# Patient Record
Sex: Male | Born: 1960 | Race: White | Hispanic: No | Marital: Married | Smoking: Never smoker
Health system: Southern US, Community
[De-identification: ages and names within clinical notes are randomized; demographics above are authoritative.]

## PROBLEM LIST (undated history)

## (undated) DIAGNOSIS — E785 Hyperlipidemia, unspecified: Secondary | ICD-10-CM

## (undated) DIAGNOSIS — Z87442 Personal history of urinary calculi: Secondary | ICD-10-CM

## (undated) DIAGNOSIS — J302 Other seasonal allergic rhinitis: Secondary | ICD-10-CM

## (undated) DIAGNOSIS — I214 Non-ST elevation (NSTEMI) myocardial infarction: Secondary | ICD-10-CM

## (undated) DIAGNOSIS — I209 Angina pectoris, unspecified: Secondary | ICD-10-CM

## (undated) DIAGNOSIS — I2510 Atherosclerotic heart disease of native coronary artery without angina pectoris: Secondary | ICD-10-CM

## (undated) DIAGNOSIS — I700 Atherosclerosis of aorta: Secondary | ICD-10-CM

## (undated) DIAGNOSIS — N200 Calculus of kidney: Secondary | ICD-10-CM

## (undated) HISTORY — DX: Calculus of kidney: N20.0

## (undated) HISTORY — PX: CORONARY ANGIOPLASTY WITH STENT PLACEMENT: SHX49

## (undated) HISTORY — PX: HERNIA REPAIR: SHX51

## (undated) HISTORY — PX: SHOULDER ARTHROSCOPY: SHX128

## (undated) HISTORY — PX: CYST EXCISION: SHX5701

---

## 2006-05-09 ENCOUNTER — Ambulatory Visit: Payer: Self-pay | Admitting: Family Medicine

## 2009-08-17 ENCOUNTER — Ambulatory Visit: Payer: Self-pay | Admitting: Family Medicine

## 2010-01-16 ENCOUNTER — Ambulatory Visit: Payer: Self-pay | Admitting: Family Medicine

## 2010-06-27 ENCOUNTER — Emergency Department: Payer: Self-pay | Admitting: Emergency Medicine

## 2011-08-23 IMAGING — CR DG CHEST 2V
1 series · 2 of 2 positions shown · non-contrast
Comparison: none

REASON FOR EXAM: chest tightness
COMMENTS:

PROCEDURE:     MDR - MDR CHEST PA(OR AP) AND LATERAL  - August 17, 2009 [DATE]
RESULT:     The lungs are clear. The cardiac silhouette and visualized bony
skeleton are unremarkable.

[Series 1: view not recorded · 0.17mm/px · 2 of 2 slices shown]
[im 1/2]
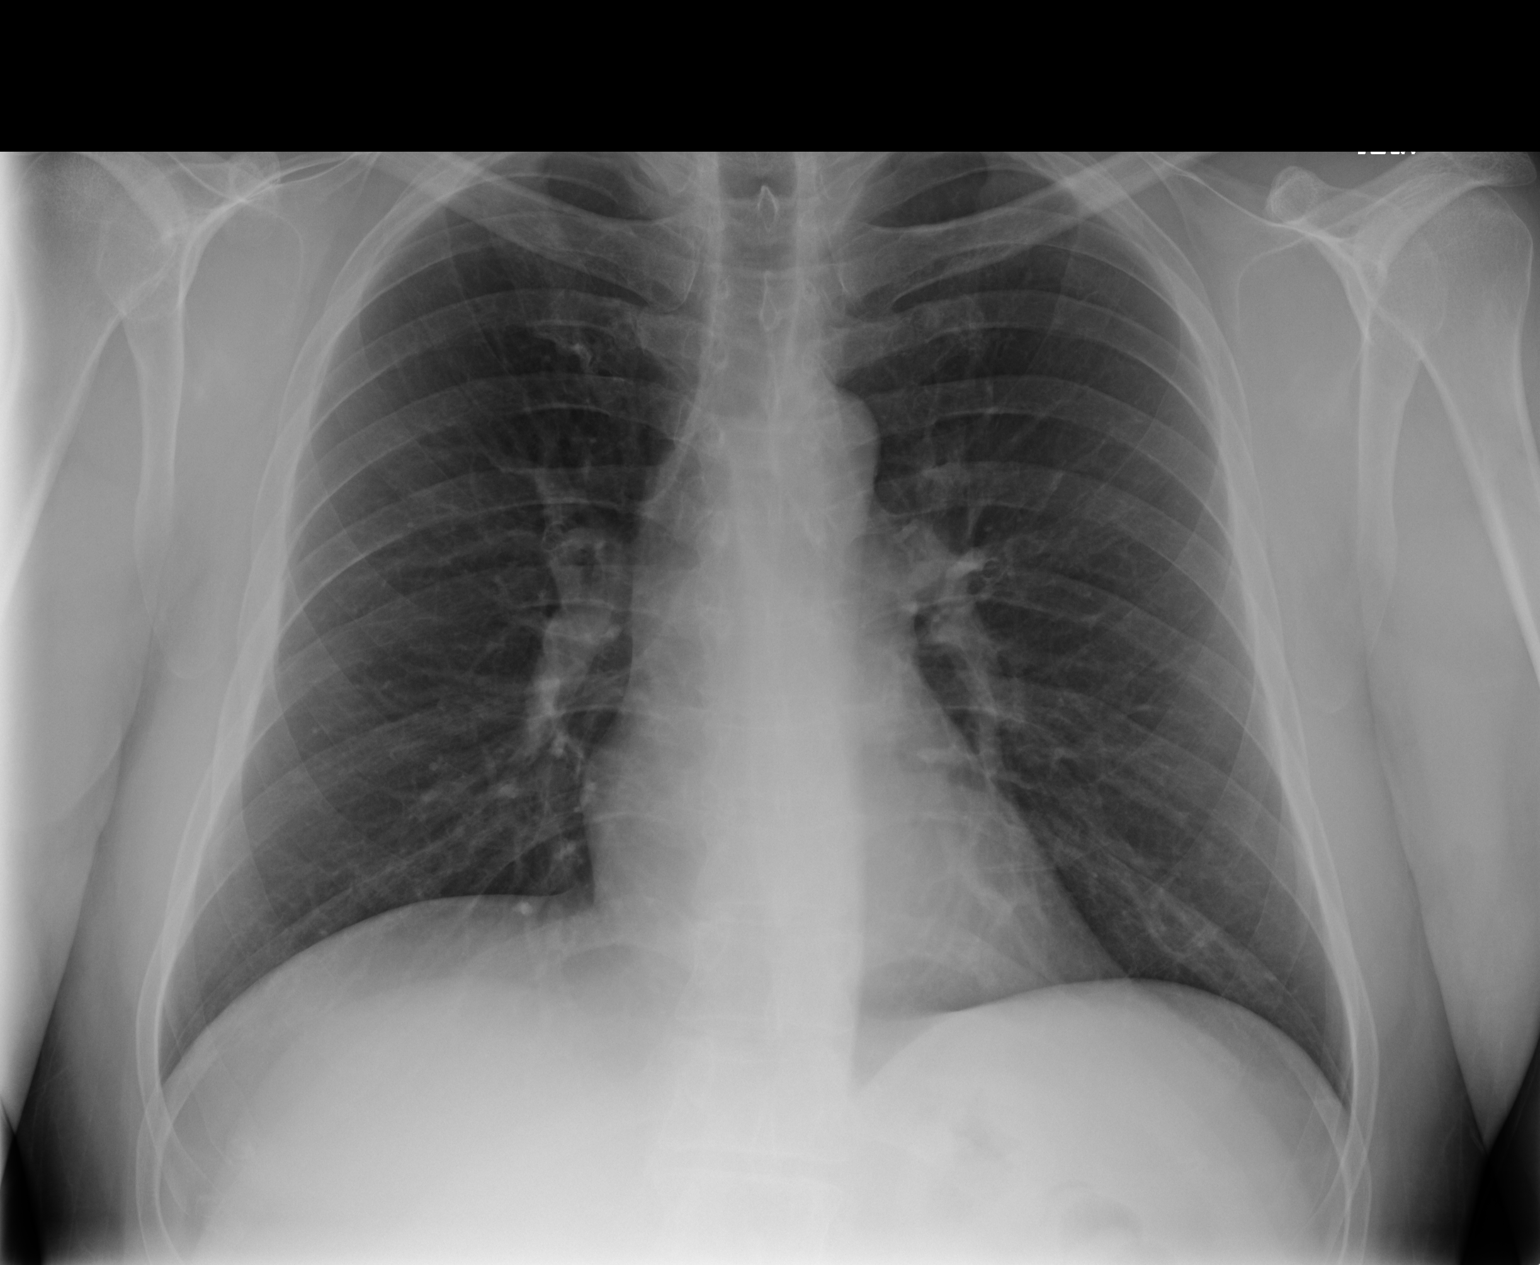
[im 2/2]
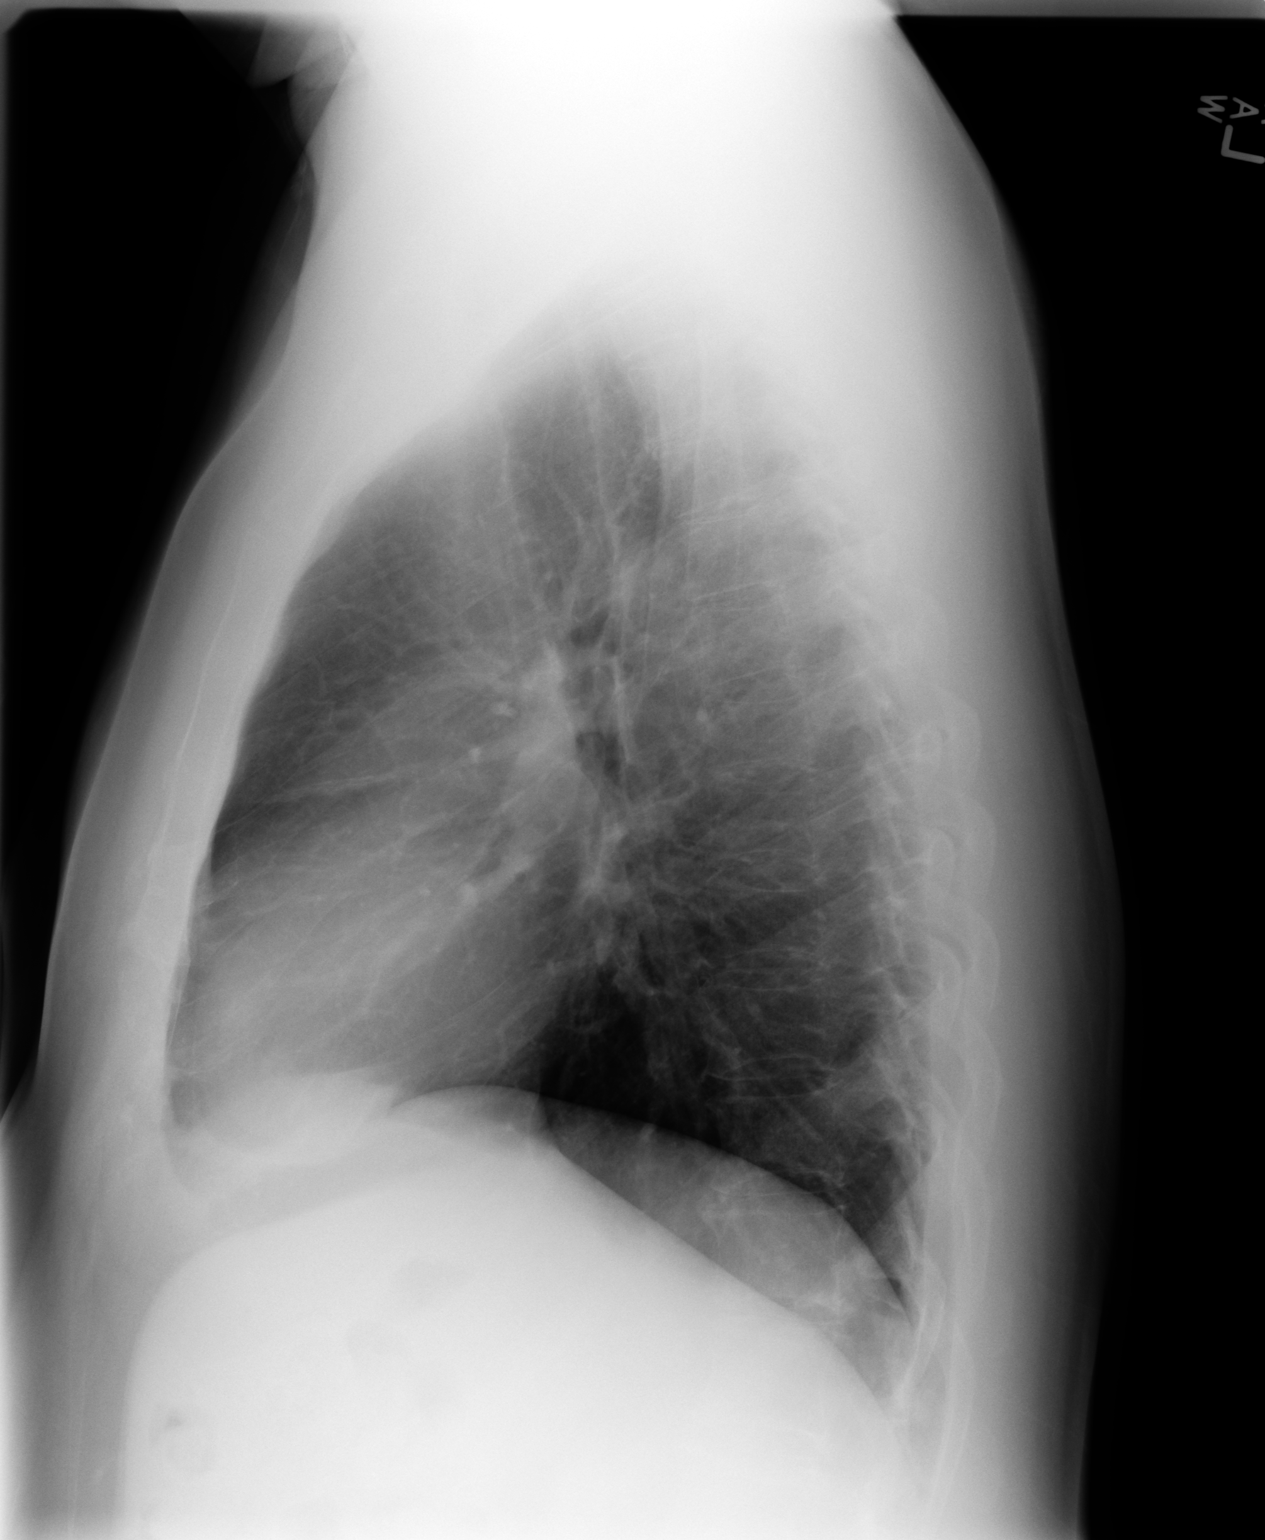

[2 of 2 positions shown; findings below may reference images not displayed]

IMPRESSION: 1. Chest radiograph without evidence of acute cardiopulmonary disease.

## 2012-01-18 ENCOUNTER — Emergency Department: Payer: Self-pay | Admitting: Emergency Medicine

## 2012-01-18 LAB — BASIC METABOLIC PANEL
Anion Gap: 11 (ref 7–16)
Calcium, Total: 8.9 mg/dL (ref 8.5–10.1)
Chloride: 103 mmol/L (ref 98–107)
Co2: 28 mmol/L (ref 21–32)
Creatinine: 0.92 mg/dL (ref 0.60–1.30)
Osmolality: 284 (ref 275–301)

## 2012-01-18 LAB — CBC
HCT: 42 % (ref 40.0–52.0)
HGB: 14.2 g/dL (ref 13.0–18.0)
MCH: 30.6 pg (ref 26.0–34.0)
MCV: 90 fL (ref 80–100)
Platelet: 204 10*3/uL (ref 150–440)
RBC: 4.65 10*6/uL (ref 4.40–5.90)
WBC: 6.7 10*3/uL (ref 3.8–10.6)

## 2012-01-18 LAB — TROPONIN I: Troponin-I: <0.02 ng/mL

## 2012-10-15 ENCOUNTER — Ambulatory Visit: Payer: Self-pay | Admitting: Family Medicine

## 2014-07-24 DIAGNOSIS — J309 Allergic rhinitis, unspecified: Secondary | ICD-10-CM | POA: Insufficient documentation

## 2014-10-01 ENCOUNTER — Ambulatory Visit: Payer: Self-pay | Admitting: Physician Assistant

## 2014-10-01 LAB — RAPID INFLUENZA A&B ANTIGENS

## 2015-05-17 ENCOUNTER — Ambulatory Visit
Admission: EM | Admit: 2015-05-17 | Discharge: 2015-05-17 | Disposition: A | Discharge status: Home / Self Care | Payer: Federal, State, Local not specified - PPO | Attending: Family Medicine | Admitting: Family Medicine

## 2015-05-17 ENCOUNTER — Encounter: Payer: Self-pay | Admitting: Emergency Medicine

## 2015-05-17 DIAGNOSIS — J029 Acute pharyngitis, unspecified: Secondary | ICD-10-CM | POA: Diagnosis present

## 2015-05-17 HISTORY — DX: Other seasonal allergic rhinitis: J30.2

## 2015-05-17 LAB — RAPID STREP SCREEN (MED CTR MEBANE ONLY): STREPTOCOCCUS, GROUP A SCREEN (DIRECT): NEGATIVE

## 2015-05-17 MED ORDER — AZITHROMYCIN 250 MG PO TABS: ORAL_TABLET | ORAL | Status: DC

## 2015-05-17 NOTE — ED Notes (Signed)
C/o sore throat since Wednesday associtaed with running nose , denies any fever at this time

## 2015-05-17 NOTE — ED Provider Notes (Signed)
CSN: [id number]     Arrival date & time 05/17/15  0807 History   First MD Initiated Contact with Patient 05/17/15 [phone]     Chief Complaint  Patient presents with  . Sore Throat   (Consider location/radiation/quality/duration/timing/severity/associated sxs/prior Treatment) HPI Comments: 54 yo male with a 3 days h/o sore throat, pain with swallowing and sweats. States has had a mild runny nose, but that symptoms don't feel like a cold. Also sick contact;  was exposed to strep last week. Denies chest pains or shortness of breath.   Patient is a 54 y.o. male presenting with pharyngitis. The history is provided by the patient.  Sore Throat This is a new problem. The current episode started more than 2 days ago. The problem occurs constantly. The problem has been gradually worsening.    Past Medical History  Diagnosis Date  . Seasonal allergies    Past Surgical History  Procedure Laterality Date  . Hernia repair    . Shoulder arthroscopy    . Cyst excision     History reviewed. No pertinent family history. History  Substance Use Topics  . Smoking status: Never Smoker   . Smokeless tobacco: Not on file  . Alcohol Use: No    Review of Systems  Allergies  Codeine and Penicillins  Home Medications   Prior to Admission medications   Medication Sig Start Date End Date Taking? Authorizing Provider  fluticasone (FLONASE) 50 MCG/ACT nasal spray Place into both nostrils daily.   Yes Historical Provider, MD  loratadine (CLARITIN) 10 MG tablet Take 10 mg by mouth daily.   Yes Historical Provider, MD  azithromycin (ZITHROMAX Z-PAK) 250 MG tablet 2 tabs po once day 1, then 1 tab po qd for next 4 days 05/17/15   Payton Mccallumrlando Lyniah Fujita, MD   BP 125/92 mmHg  Pulse 75  Temp(Src) 98.2 F (36.8 C) (Oral)  Resp 18  Ht 5\' 9"  (1.753 m)  Wt 198 lb (89.812 kg)  BMI 29.23 kg/m2  SpO2 95% Physical Exam  Constitutional: He appears well-developed and well-nourished. No distress.  HENT:  Head:  Normocephalic and atraumatic.  Right Ear: Tympanic membrane, external ear and ear canal normal.  Left Ear: Tympanic membrane, external ear and ear canal normal.  Nose: Nose normal.  Mouth/Throat: Uvula is midline and mucous membranes are normal. Posterior oropharyngeal erythema present. No oropharyngeal exudate, posterior oropharyngeal edema or tonsillar abscesses.  Eyes: Conjunctivae and EOM are normal. Pupils are equal, round, and reactive to light. Right eye exhibits no discharge. Left eye exhibits no discharge. No scleral icterus.  Neck: Normal range of motion. Neck supple. No tracheal deviation present. No thyromegaly present.  Cardiovascular: Normal rate, regular rhythm and normal heart sounds.   Pulmonary/Chest: Effort normal and breath sounds normal. No stridor. No respiratory distress. He has no wheezes. He has no rales. He exhibits no tenderness.  Lymphadenopathy:    He has no cervical adenopathy.  Neurological: He is alert.  Skin: Skin is warm and dry. No rash noted. He is not diaphoretic.  Nursing note and vitals reviewed.   ED Course  Procedures (including critical care time) Labs Review Labs Reviewed  RAPID STREP SCREEN (NOT AT [facility])  CULTURE, GROUP A STREP ([facility] ONLY)    Imaging Review No results found.   MDM   1. Pharyngitis   (likely strep)  Discharge Medication List as of 05/17/2015  8:52 AM    START taking these medications   Details  azithromycin (ZITHROMAX Z-PAK) 250 MG tablet  2 tabs po once day 1, then 1 tab po qd for next 4 days, Normal      Plan: 1. Test results and diagnosis reviewed with patient 2. rx as per orders; risks, benefits, potential side effects reviewed with patient 3. Recommend supportive treatment with salt water gargles, otc analgesics prn 4. F/u prn if symptoms worsen or don't improve    Payton Mccallum, MD 05/17/15 [phone]

## 2015-05-20 LAB — CULTURE, GROUP A STREP (THRC)

## 2015-10-23 DIAGNOSIS — M19019 Primary osteoarthritis, unspecified shoulder: Secondary | ICD-10-CM | POA: Insufficient documentation

## 2016-01-27 ENCOUNTER — Encounter: Payer: Self-pay | Admitting: *Deleted

## 2016-01-27 DIAGNOSIS — R079 Chest pain, unspecified: Secondary | ICD-10-CM | POA: Insufficient documentation

## 2016-01-27 DIAGNOSIS — Z880 Allergy status to penicillin: Secondary | ICD-10-CM | POA: Diagnosis not present

## 2016-01-27 DIAGNOSIS — Z79899 Other long term (current) drug therapy: Secondary | ICD-10-CM | POA: Insufficient documentation

## 2016-01-27 DIAGNOSIS — Z791 Long term (current) use of non-steroidal anti-inflammatories (NSAID): Secondary | ICD-10-CM | POA: Insufficient documentation

## 2016-01-27 DIAGNOSIS — R1013 Epigastric pain: Secondary | ICD-10-CM | POA: Insufficient documentation

## 2016-01-27 DIAGNOSIS — Z7951 Long term (current) use of inhaled steroids: Secondary | ICD-10-CM | POA: Diagnosis not present

## 2016-01-27 LAB — CBC
HCT: 40.9 % (ref 40.0–52.0)
HEMOGLOBIN: 14.2 g/dL (ref 13.0–18.0)
MCH: 30.7 pg (ref 26.0–34.0)
MCHC: 34.7 g/dL (ref 32.0–36.0)
MCV: 88.5 fL (ref 80.0–100.0)
Platelets: 214 10*3/uL (ref 150–440)
RBC: 4.63 MIL/uL (ref 4.40–5.90)
RDW: 12.9 % (ref 11.5–14.5)
WBC: 11.6 10*3/uL — AB (ref 3.8–10.6)

## 2016-01-27 LAB — BASIC METABOLIC PANEL
ANION GAP: 6 (ref 5–15)
BUN: 22 mg/dL — ABNORMAL HIGH (ref 6–20)
CHLORIDE: 106 mmol/L (ref 101–111)
CO2: 26 mmol/L (ref 22–32)
Calcium: 8.8 mg/dL — ABNORMAL LOW (ref 8.9–10.3)
Creatinine, Ser: 0.95 mg/dL (ref 0.61–1.24)
Glucose, Bld: 98 mg/dL (ref 65–99)
POTASSIUM: 3.5 mmol/L (ref 3.5–5.1)
SODIUM: 138 mmol/L (ref 135–145)

## 2016-01-27 LAB — TROPONIN I

## 2016-01-27 NOTE — ED Notes (Signed)
Pt reports he has chest pain and pressure in center of chest.  nonradiating pain.  Sx for 5-6 days.  No n/v/d.  No sob. Nonsmoker.  Pt alert.

## 2016-01-28 ENCOUNTER — Emergency Department
Admission: EM | Admit: 2016-01-28 | Discharge: 2016-01-28 | Disposition: A | Discharge status: Home / Self Care | Payer: Federal, State, Local not specified - PPO | Attending: Emergency Medicine | Admitting: Emergency Medicine

## 2016-01-28 ENCOUNTER — Emergency Department: Payer: Federal, State, Local not specified - PPO

## 2016-01-28 DIAGNOSIS — R079 Chest pain, unspecified: Secondary | ICD-10-CM

## 2016-01-28 LAB — HEPATIC FUNCTION PANEL
ALK PHOS: 58 U/L (ref 38–126)
ALT: 23 U/L (ref 17–63)
AST: 23 U/L (ref 15–41)
Albumin: 4.2 g/dL (ref 3.5–5.0)
Bilirubin, Direct: 0.1 mg/dL (ref 0.1–0.5)
Indirect Bilirubin: 0.6 mg/dL (ref 0.3–0.9)
Total Bilirubin: 0.7 mg/dL (ref 0.3–1.2)
Total Protein: 7.4 g/dL (ref 6.5–8.1)

## 2016-01-28 LAB — LIPASE, BLOOD: Lipase: 23 U/L (ref 11–51)

## 2016-01-28 NOTE — ED Provider Notes (Signed)
[facility] Emergency Department Provider Note  ____________________________________________  Time seen: Approximately 12:58 AM  I have reviewed the triage vital signs and the nursing notes.   HISTORY  Chief Complaint Chest Pain    HPI Brian Waters is a 55 y.o. male presents to the ED from home with a chief complain of chest pain. Onset of symptoms 1-1/2 weeks ago. Patient describes central chest tightness/pressure without associated diaphoresis, nausea/vomiting, shortness of breath or dizziness. Patient states he occasionally feels palpitations. 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 Denies recent fever, chills, cough, congestion, abdominal pain, diarrhea. [place] to [place] last [place]; he was already experiencing chest pain before traveling. Denies calf pain or swelling upon his return. Denies recent trauma.   Past Medical History  Diagnosis Date  . Seasonal allergies     There are no active problems to display for this patient.   Past Surgical History  Procedure Laterality Date  . Hernia repair    . Shoulder arthroscopy    . Cyst excision      Current Outpatient Rx  Name  Route  Sig  Dispense  Refill  . fluticasone (FLONASE) 50 MCG/ACT nasal spray   Each Nare   Place 2 sprays into both nostrils daily.          Marland Kitchen. loratadine (CLARITIN) 10 MG tablet   Oral   Take 10 mg by mouth daily.         . naproxen (NAPROSYN) 375 MG tablet   Oral   Take 375 mg by mouth 2 (two) times daily with a meal.         . azithromycin (ZITHROMAX Z-PAK) 250 MG tablet      2 tabs po once day 1, then 1 tab po qd for next 4 days Patient not taking: Reported on 01/28/2016   6 each   0     Allergies Codeine and Penicillins  No family history on file.  Social History Social History  Substance Use Topics  . Smoking status: Never Smoker   . Smokeless tobacco: None   . Alcohol Use: Yes    Review of Systems  Constitutional: No fever/chills Eyes: No visual changes. ENT: No sore throat. Cardiovascular: Positive for chest pain. Respiratory: Denies shortness of breath. Gastrointestinal: No abdominal pain.  No nausea, no vomiting.  No diarrhea.  No constipation. Genitourinary: Negative for dysuria. Musculoskeletal: Negative for back pain. Skin: Negative for rash. Neurological: Negative for headaches, focal weakness or numbness.  10-point ROS otherwise negative.  ____________________________________________   PHYSICAL EXAM:  VITAL SIGNS: ED Triage Vitals  Enc Vitals Group     BP 01/27/16 2000 144/85 mmHg     Pulse Rate 01/27/16 2000 68     Resp 01/27/16 2000 18     Temp 01/27/16 2000 97.8 F (36.6 C)     Temp Source 01/27/16 2000 Oral     SpO2 01/27/16 2000 98 %     Weight 01/27/16 2000 196 lb (88.905 kg)     Height 01/27/16 2000 5\' 9"  (1.753 m)     Head Cir --      Peak Flow --      Pain Score 01/27/16 2004 2     Pain Loc --      Pain Edu? --      Excl. in GC? --     Constitutional: Alert and oriented. Well appearing  and in no acute distress. Eyes: Conjunctivae are normal. PERRL. EOMI. Head: Atraumatic. Nose: No congestion/rhinnorhea. Mouth/Throat: Mucous membranes are moist.  Oropharynx non-erythematous. Neck: No stridor.  No carotid bruits. Cardiovascular: Normal rate, regular rhythm. Grossly normal heart sounds.  Good peripheral circulation. Respiratory: Normal respiratory effort.  No retractions. Lungs CTAB. Gastrointestinal: Soft and minimally tender to palpation epigastrium without rebound or guarding. No distention. No abdominal bruits. No CVA tenderness. Musculoskeletal: No lower extremity tenderness nor edema.  No joint effusions. Neurologic:  Normal speech and language. No gross focal neurologic deficits are appreciated. No gait instability. Skin:  Skin is warm, dry and intact. No rash noted. Psychiatric: Mood and  affect are normal. Speech and behavior are normal.  ____________________________________________   LABS (all labs ordered are listed, but only abnormal results are displayed)  Labs Reviewed  BASIC METABOLIC PANEL - Abnormal; Notable for the following:    BUN 22 (*)    Calcium 8.8 (*)    All other components within normal limits  CBC - Abnormal; Notable for the following:    WBC 11.6 (*)    All other components within normal limits  TROPONIN I  HEPATIC FUNCTION PANEL  LIPASE, BLOOD   ____________________________________________  EKG  ED ECG REPORT I, Jahzier Villalon J, the attending physician, personally viewed and interpreted this ECG.   Date: 01/28/2016  EKG Time: 1958  Rate: 63  Rhythm: normal EKG, normal sinus rhythm  Axis: Normal  Intervals:none  ST&T Change: Nonspecific  ____________________________________________  RADIOLOGY  Chest 2 view (viewed by me, interpreted per Dr. Andria Meuse): No active cardiopulmonary disease. ____________________________________________   PROCEDURES  Procedure(s) performed: None  Critical Care performed: No  ____________________________________________   INITIAL IMPRESSION / ASSESSMENT AND PLAN / ED COURSE  Pertinent labs & imaging results that were available during my care of the patient were reviewed by me and considered in my medical decision making (see chart for details).  55 year old male who presents with chest pain 1-1/2 weeks, worsened by stress. Patient has a family history of coronary artery disease; otherwise no risk factors. Currently voices no complaints of chest pain. Initial EKG and troponin are reassuring. Given his mild epigastric tenderness, will also check LFTs and lipase. Low suspicion of ACS given negative troponin in the setting of chest pain for over one week. Low suspicion for PE given patient is not tachypneic nor hypoxic. If LFTs and lipase are negative, will plan to have patient follow-up as an outpatient  with cardiology.  ----------------------------------------- 2:37 AM on 01/28/2016 -----------------------------------------  Multiple calls to lab to check on results of LFTs and lipase. Told they are having technical issues. Updated patient and spouse; will discharge home at this time with close cardiology follow-up. Will call patient in am with any abnormal lab results. Strict return precautions given. Both verbalize understanding and agree with plan of care.  ----------------------------------------- 6:50 AM on 01/28/2016 -----------------------------------------  Checked with lab again regarding results of LFTs and lipase. I am told the technical difficulties persist and they will continue to process the specimen. Will ask nurse navigator to follow up on results.  ____________________________________________   FINAL CLINICAL IMPRESSION(S) / ED DIAGNOSES  Final diagnoses:  Chest pain, unspecified chest pain type      Irean Hong, MD 01/28/16 [phone]

## 2016-01-28 NOTE — ED Notes (Signed)
Pt returned to room from chest xray. Pt able to ambulate without difficulty and appears to be in no acute distress at this time.

## 2016-01-28 NOTE — ED Notes (Signed)
Discharge instructions reviewed with patient. Patient verbalized understanding. Patient ambulated to lobby without difficulty.   

## 2016-01-28 NOTE — ED Notes (Signed)
MD at bedside. 

## 2016-01-28 NOTE — Discharge Instructions (Signed)
1. Please follow up with the cardiologist in the next 1-2 days. 2. Return to the ER for worsening symptoms, persistent vomiting, difficulty breathing or other concerns.   Nonspecific Chest Pain  Chest pain can be caused by many different conditions. There is always a chance that your pain could be related to something serious, such as a heart attack or a blood clot in your lungs. Chest pain can also be caused by conditions that are not life-threatening. If you have chest pain, it is very important to follow up with your health care provider. CAUSES  Chest pain can be caused by:  Heartburn.  Pneumonia or bronchitis.  Anxiety or stress.  Inflammation around your heart (pericarditis) or lung (pleuritis or pleurisy).  A blood clot in your lung.  A collapsed lung (pneumothorax). It can develop suddenly on its own (spontaneous pneumothorax) or from trauma to the chest.  Shingles infection (varicella-zoster virus).  Heart attack.  Damage to the bones, muscles, and cartilage that make up your chest wall. This can include:  Bruised bones due to injury.  Strained muscles or cartilage due to frequent or repeated coughing or overwork.  Fracture to one or more ribs.  Sore cartilage due to inflammation (costochondritis). RISK FACTORS  Risk factors for chest pain may include:  Activities that increase your risk for trauma or injury to your chest.  Respiratory infections or conditions that cause frequent coughing.  Medical conditions or overeating that can cause heartburn.  Heart disease or family history of heart disease.  Conditions or health behaviors that increase your risk of developing a blood clot.  Having had chicken pox (varicella zoster). SIGNS AND SYMPTOMS Chest pain can feel like:  Burning or tingling on the surface of your chest or deep in your chest.  Crushing, pressure, aching, or squeezing pain.  Dull or sharp pain that is worse when you move, cough, or take a  deep breath.  Pain that is also felt in your back, neck, shoulder, or arm, or pain that spreads to any of these areas. Your chest pain may come and go, or it may stay constant. DIAGNOSIS Lab tests or other studies may be needed to find the cause of your pain. Your health care provider may have you take a test called an ambulatory ECG (electrocardiogram). An ECG records your heartbeat patterns at the time the test is performed. You may also have other tests, such as:  Transthoracic echocardiogram (TTE). During echocardiography, sound waves are used to create a picture of all of the heart structures and to look at how blood flows through your heart.  Transesophageal echocardiogram (TEE).This is a more advanced imaging test that obtains images from inside your body. It allows your health care provider to see your heart in finer detail.  Cardiac monitoring. This allows your health care provider to monitor your heart rate and rhythm in real time.  Holter monitor. This is a portable device that records your heartbeat and can help to diagnose abnormal heartbeats. It allows your health care provider to track your heart activity for several days, if needed.  Stress tests. These can be done through exercise or by taking medicine that makes your heart beat more quickly.  Blood tests.  Imaging tests. TREATMENT  Your treatment depends on what is causing your chest pain. Treatment may include:  Medicines. These may include:  Acid blockers for heartburn.  Anti-inflammatory medicine.  Pain medicine for inflammatory conditions.  Antibiotic medicine, if an infection is present.  Medicines  to dissolve blood clots.  Medicines to treat coronary artery disease.  Supportive care for conditions that do not require medicines. This may include:  Resting.  Applying heat or cold packs to injured areas.  Limiting activities until pain decreases. HOME CARE INSTRUCTIONS  If you were prescribed an  antibiotic medicine, finish it all even if you start to feel better.  Avoid any activities that bring on chest pain.  Do not use any tobacco products, including cigarettes, chewing tobacco, or electronic cigarettes. If you need help quitting, ask your health care provider.  Do not drink alcohol.  Take medicines only as directed by your health care provider.  Keep all follow-up visits as directed by your health care provider. This is important. This includes any further testing if your chest pain does not go away.  If heartburn is the cause for your chest pain, you may be told to keep your head raised (elevated) while sleeping. This reduces the chance that acid will go from your stomach into your esophagus.  Make lifestyle changes as directed by your health care provider. These may include:  Getting regular exercise. Ask your health care provider to suggest some activities that are safe for you.  Eating a heart-healthy diet. A registered dietitian can help you to learn healthy eating options.  Maintaining a healthy weight.  Managing diabetes, if necessary.  Reducing stress. SEEK MEDICAL CARE IF:  Your chest pain does not go away after treatment.  You have a rash with blisters on your chest.  You have a fever. SEEK IMMEDIATE MEDICAL CARE IF:   Your chest pain is worse.  You have an increasing cough, or you cough up blood.  You have severe abdominal pain.  You have severe weakness.  You faint.  You have chills.  You have sudden, unexplained chest discomfort.  You have sudden, unexplained discomfort in your arms, back, neck, or jaw.  You have shortness of breath at any time.  You suddenly start to sweat, or your skin gets clammy.  You feel nauseous or you vomit.  You suddenly feel light-headed or dizzy.  Your heart begins to beat quickly, or it feels like it is skipping beats. These symptoms may represent a serious problem that is an emergency. Do not wait to  see if the symptoms will go away. Get medical help right away. Call your local emergency services (911 in the [place] Do not drive yourself to the hospital.   This information is not intended to replace advice given to you by your health care provider. Make sure you discuss any questions you have with your health care provider.   Document Released: 08/04/2005 Document Revised: 11/15/2014 Document Reviewed: 05/31/2014 Elsevier Interactive Patient Education [employer].

## 2016-01-31 ENCOUNTER — Inpatient Hospital Stay
Admission: EM | Admit: 2016-01-31 | Discharge: 2016-02-03 | DRG: 247 | Disposition: A | Discharge status: Home / Self Care | Payer: Federal, State, Local not specified - PPO | Attending: Specialist | Admitting: Specialist

## 2016-01-31 ENCOUNTER — Encounter: Payer: Self-pay | Admitting: Emergency Medicine

## 2016-01-31 ENCOUNTER — Observation Stay: Payer: Federal, State, Local not specified - PPO

## 2016-01-31 DIAGNOSIS — Z8249 Family history of ischemic heart disease and other diseases of the circulatory system: Secondary | ICD-10-CM

## 2016-01-31 DIAGNOSIS — Z885 Allergy status to narcotic agent status: Secondary | ICD-10-CM

## 2016-01-31 DIAGNOSIS — Z7951 Long term (current) use of inhaled steroids: Secondary | ICD-10-CM

## 2016-01-31 DIAGNOSIS — I214 Non-ST elevation (NSTEMI) myocardial infarction: Principal | ICD-10-CM

## 2016-01-31 DIAGNOSIS — Z880 Allergy status to penicillin: Secondary | ICD-10-CM

## 2016-01-31 DIAGNOSIS — R079 Chest pain, unspecified: Secondary | ICD-10-CM

## 2016-01-31 DIAGNOSIS — Z7982 Long term (current) use of aspirin: Secondary | ICD-10-CM

## 2016-01-31 DIAGNOSIS — I25110 Atherosclerotic heart disease of native coronary artery with unstable angina pectoris: Secondary | ICD-10-CM | POA: Diagnosis present

## 2016-01-31 DIAGNOSIS — R0602 Shortness of breath: Secondary | ICD-10-CM

## 2016-01-31 DIAGNOSIS — J302 Other seasonal allergic rhinitis: Secondary | ICD-10-CM | POA: Diagnosis present

## 2016-01-31 LAB — CBC
HCT: 40.3 % (ref 40.0–52.0)
Hemoglobin: 14.2 g/dL (ref 13.0–18.0)
MCH: 31.1 pg (ref 26.0–34.0)
MCHC: 35.3 g/dL (ref 32.0–36.0)
MCV: 88 fL (ref 80.0–100.0)
PLATELETS: 204 10*3/uL (ref 150–440)
RBC: 4.58 MIL/uL (ref 4.40–5.90)
RDW: 12.8 % (ref 11.5–14.5)
WBC: 8.9 10*3/uL (ref 3.8–10.6)

## 2016-01-31 LAB — BASIC METABOLIC PANEL
Anion gap: 4 — ABNORMAL LOW (ref 5–15)
BUN: 20 mg/dL (ref 6–20)
CALCIUM: 8.9 mg/dL (ref 8.9–10.3)
CO2: 27 mmol/L (ref 22–32)
CREATININE: 0.88 mg/dL (ref 0.61–1.24)
Chloride: 108 mmol/L (ref 101–111)
GFR calc Af Amer: >60 mL/min (ref >60)
GFR calc non Af Amer: >60 mL/min (ref >60)
GLUCOSE: 98 mg/dL (ref 65–99)
Potassium: 3.7 mmol/L (ref 3.5–5.1)
Sodium: 139 mmol/L (ref 135–145)

## 2016-01-31 LAB — TROPONIN I
TROPONIN I: 0.08 ng/mL — AB (ref <0.031)
TROPONIN I: 0.09 ng/mL — AB (ref <0.031)
Troponin I: 0.04 ng/mL — ABNORMAL HIGH (ref <0.031)

## 2016-01-31 MED ORDER — ASPIRIN 81 MG PO CHEW
324.0000 mg | CHEWABLE_TABLET | Freq: Once | ORAL | Status: AC
  Administered 2016-01-31: 324 mg via ORAL
  Filled 2016-01-31: qty 4

## 2016-01-31 MED ORDER — ACETAMINOPHEN 650 MG RE SUPP: 650.0000 mg | Freq: Four times a day (QID) | RECTAL | Status: DC | PRN

## 2016-01-31 MED ORDER — IOPAMIDOL (ISOVUE-370) INJECTION 76%
75.0000 mL | Freq: Once | INTRAVENOUS | Status: AC | PRN
  Administered 2016-01-31: 75 mL via INTRAVENOUS

## 2016-01-31 MED ORDER — LORATADINE 10 MG PO TABS
10.0000 mg | ORAL_TABLET | Freq: Every day | ORAL | Status: DC
  Administered 2016-02-03: 10 mg via ORAL
  Filled 2016-01-31: qty 1

## 2016-01-31 MED ORDER — HYDROCODONE-ACETAMINOPHEN 5-325 MG PO TABS
1.0000 | ORAL_TABLET | ORAL | Status: DC | PRN
  Administered 2016-01-31: 1 via ORAL
  Filled 2016-01-31: qty 1

## 2016-01-31 MED ORDER — PANTOPRAZOLE SODIUM 40 MG PO TBEC
40.0000 mg | DELAYED_RELEASE_TABLET | Freq: Two times a day (BID) | ORAL | Status: DC
  Administered 2016-01-31 – 2016-02-03 (×6): 40 mg via ORAL
  Filled 2016-01-31 (×6): qty 1

## 2016-01-31 MED ORDER — ASPIRIN EC 325 MG PO TBEC: 325.0000 mg | DELAYED_RELEASE_TABLET | Freq: Every day | ORAL | Status: DC

## 2016-01-31 MED ORDER — SODIUM CHLORIDE 0.9% FLUSH
3.0000 mL | Freq: Two times a day (BID) | INTRAVENOUS | Status: DC
  Administered 2016-01-31 – 2016-02-03 (×4): 3 mL via INTRAVENOUS

## 2016-01-31 MED ORDER — ACETAMINOPHEN 325 MG PO TABS: 650.0000 mg | ORAL_TABLET | Freq: Four times a day (QID) | ORAL | Status: DC | PRN

## 2016-01-31 MED ORDER — MORPHINE SULFATE (PF) 2 MG/ML IV SOLN: 1.0000 mg | INTRAVENOUS | Status: DC | PRN

## 2016-01-31 MED ORDER — SODIUM CHLORIDE 0.9% FLUSH: 3.0000 mL | INTRAVENOUS | Status: DC | PRN

## 2016-01-31 MED ORDER — NAPROXEN 375 MG PO TABS
375.0000 mg | ORAL_TABLET | Freq: Two times a day (BID) | ORAL | Status: DC
  Administered 2016-02-02: 375 mg via ORAL
  Filled 2016-01-31 (×5): qty 1

## 2016-01-31 MED ORDER — ASPIRIN EC 325 MG PO TBEC
325.0000 mg | DELAYED_RELEASE_TABLET | Freq: Every day | ORAL | Status: DC
  Administered 2016-02-01: 325 mg via ORAL
  Filled 2016-01-31 (×2): qty 1

## 2016-01-31 MED ORDER — FLUTICASONE PROPIONATE 50 MCG/ACT NA SUSP
2.0000 | Freq: Every day | NASAL | Status: DC
  Administered 2016-02-03: 2 via NASAL
  Filled 2016-01-31: qty 16

## 2016-01-31 MED ORDER — ASPIRIN EC 325 MG PO TBEC
325.0000 mg | DELAYED_RELEASE_TABLET | Freq: Every day | ORAL | Status: DC
  Administered 2016-02-02: 325 mg via ORAL

## 2016-01-31 MED ORDER — ENOXAPARIN SODIUM 40 MG/0.4ML ~~LOC~~ SOLN
40.0000 mg | SUBCUTANEOUS | Status: DC
Start: 2016-01-31 — End: 2016-02-03
  Administered 2016-01-31 – 2016-02-02 (×3): 40 mg via SUBCUTANEOUS
  Filled 2016-01-31 (×3): qty 0.4

## 2016-01-31 MED ORDER — SODIUM CHLORIDE 0.9 % IV SOLN: 250.0000 mL | INTRAVENOUS | Status: DC | PRN

## 2016-01-31 NOTE — ED Provider Notes (Signed)
[facility] Emergency Department Provider Note  Time seen: 1:03 PM  I have reviewed the triage vital signs and the nursing notes.   HISTORY  Chief Complaint Chest Pain    HPI Brian Waters is a 55 y.o. male with a past medical history of seasonal allergies who presents the emergency department with chest pain. According to the patient for the past 1 week he has been having intermittent chest pain. He was seen in the emergency department approximately4 days ago for the same. Had a negative workup at that time was discharged with Dr. Gwen PoundsKowalski follow-up for a stress test. Patient received 2 stress test yesterday states he was told it looked good. This morning patient once again had significant left-sided chest pain which she describes as a dull aching pressure sensation to the left chest 10/10 in severity associated with diaphoresis. Denies nausea or shortness of breath. Denies leg pain or swelling. Denies any history of chest pain in the past.     Past Medical History  Diagnosis Date  . Seasonal allergies     There are no active problems to display for this patient.   Past Surgical History  Procedure Laterality Date  . Hernia repair    . Shoulder arthroscopy    . Cyst excision      Current Outpatient Rx  Name  Route  Sig  Dispense  Refill  . azithromycin (ZITHROMAX Z-PAK) 250 MG tablet      2 tabs po once day 1, then 1 tab po qd for next 4 days Patient not taking: Reported on 01/28/2016   6 each   0   . fluticasone (FLONASE) 50 MCG/ACT nasal spray   Each Nare   Place 2 sprays into both nostrils daily.          Marland Kitchen. loratadine (CLARITIN) 10 MG tablet   Oral   Take 10 mg by mouth daily.         . naproxen (NAPROSYN) 375 MG tablet   Oral   Take 375 mg by mouth 2 (two) times daily with a meal.           Allergies Codeine and Penicillins  No family history on file.  Social History Social History  Substance Use Topics  . Smoking  status: Never Smoker   . Smokeless tobacco: None  . Alcohol Use: Yes    Review of Systems Constitutional: Negative for fever. Cardiovascular: Positive for chest pain Respiratory: Negative for shortness of breath. Gastrointestinal: Negative for abdominal pain Musculoskeletal: Negative for back pain. Neurological: Negative for headache 10-point ROS otherwise negative.  ____________________________________________   PHYSICAL EXAM:  VITAL SIGNS: ED Triage Vitals  Enc Vitals Group     BP 01/31/16 1131 144/89 mmHg     Pulse Rate 01/31/16 1131 72     Resp 01/31/16 1131 18     Temp 01/31/16 1131 98 F (36.7 C)     Temp Source 01/31/16 1131 Oral     SpO2 01/31/16 1131 97 %     Weight 01/31/16 1130 196 lb (88.905 kg)     Height 01/31/16 1130 5\' 9"  (1.753 m)     Head Cir --      Peak Flow --      Pain Score 01/31/16 1130 1     Pain Loc --      Pain Edu? --      Excl. in GC? --     Constitutional: Alert and oriented. Well appearing and in  no distress. Eyes: Normal exam ENT   Head: Normocephalic and atraumatic.   Mouth/Throat: Mucous membranes are moist. Cardiovascular: Normal rate, regular rhythm. No murmur Respiratory: Normal respiratory effort without tachypnea nor retractions. Breath sounds are clear. Mild left chest tenderness palpation Gastrointestinal: Soft and nontender. No distention.   Musculoskeletal: Nontender with normal range of motion in all extremities. No leg swelling or tenderness. Neurologic:  Normal speech and language. No gross focal neurologic deficits  Psychiatric: Mood and affect are normal. Speech and behavior are normal.  ____________________________________________    EKG  EKG reviewed and interpreted by myself shows normal sinus rhythm at 69 bpm, narrow QRS, normal axis, normal intervals. No ST changes.  ____________________________________________    RADIOLOGY  Recent normal chest  x-ray.  ____________________________________________    INITIAL IMPRESSION / ASSESSMENT AND PLAN / ED COURSE  Pertinent labs & imaging results that were available during my care of the patient were reviewed by me and considered in my medical decision making (see chart for details).  Patient presents the emergency department with intermittent left-sided chest pain over the past one week, getting worse per patient. Was seen in the emergency department 4 days ago for the same, stress test yesterday for the same. Patient's troponin is slightly elevated 0.04 today. Given the patient's continued chest pain, I feel the patient would likely warrant admission to the hospital this point for further workup and possible cardiac catheterization to rule out ACS. I will discuss with Dr. Lady Gary who is on call for Dr. Gwen Pounds.    Discussed with cardiology who agrees patient warrants admission for further evaluation.  ____________________________________________   FINAL CLINICAL IMPRESSION(S) / ED DIAGNOSES  Chest pain   Minna Antis, MD 01/31/16 1323

## 2016-01-31 NOTE — ED Notes (Signed)
Pt here with c/o stabbing left sided cp that began this am while walking the dog. Pt has had recurrent cp this week, was seen here on Tuesday for the same, stress test and ekg in cardiologist office yesterday was clear per pt, EMS called today for the pain, no ST elevation seen at that time, sharp pain continued, pt came back here. Pain did come after breakfast this am, however, pt denies consistency with that finding.

## 2016-01-31 NOTE — H&P (Signed)
[facility] - [place] at [facility]   PATIENT NAME: Brian Waters    MR#:  [id number]  DATE OF BIRTH:  06-25-1961  DATE OF ADMISSION:  01/31/2016  PRIMARY CARE PHYSICIAN: Marina Goodell, MD   REQUESTING/REFERRING PHYSICIAN: Arabella Merles M.D. CHIEF COMPLAINT:   Chief Complaint  Patient presents with  . Chest Pain    HISTORY OF PRESENT ILLNESS: Brian Waters  is a 55 y.o. male with a known history of seasonal allergies. Has been having chest pain for more than a 1 week now. He described is as a pressure-type of symptoms. He was seen in the emergency room on Wednesday. He was referred to Dr. Rhae Lerner who saw the patient ended outpatient stress tests and that was negative. He was told to follow-up in few months. Patient reports that he went out to walk and he started having chest pressure again. He had to sit down every few minutes to relieve those symptoms. Patient came to the emergency room again his EKG and cardiac negative. The ER physician spoke to Dr. Ihor Austin who was on-call for cardiology recommended admission. Patient denies any shortness of breath no radiation of the pain. Denies any GI symptoms.      PAST MEDICAL HISTORY:   Past Medical History  Diagnosis Date  . Seasonal allergies     PAST SURGICAL HISTORY: Past Surgical History  Procedure Laterality Date  . Hernia repair    . Shoulder arthroscopy    . Cyst excision      SOCIAL HISTORY:  Social History  Substance Use Topics  . Smoking status: Never Smoker   . Smokeless tobacco: Not on file  . Alcohol Use: Yes    FAMILY HISTORY:  Family History  Problem Relation Age of Onset  . CAD Father     DRUG ALLERGIES:  Allergies  Allergen Reactions  . Ibuprofen Diarrhea  . Codeine Rash  . Penicillins Rash    Has patient had a PCN reaction causing immediate rash, facial/tongue/throat swelling, SOB or lightheadedness with hypotension: Yes Has patient had a PCN reaction causing  severe rash involving mucus membranes or skin necrosis: No Has patient had a PCN reaction that required hospitalization No Has patient had a PCN reaction occurring within the last 10 years: No If all of the above answers are "NO", then may proceed with Cephalosporin use.     REVIEW OF SYSTEMS:   CONSTITUTIONAL: No fever, fatigue or weakness.  EYES: No blurred or double vision.  EARS, NOSE, AND THROAT: No tinnitus or ear pain.  RESPIRATORY: No cough, shortness of breath, wheezing or hemoptysis.  CARDIOVASCULAR: Positive chest pain, orthopnea, edema.  GASTROINTESTINAL: No nausea, vomiting, diarrhea or abdominal pain.  GENITOURINARY: No dysuria, hematuria.  ENDOCRINE: No polyuria, nocturia,  HEMATOLOGY: No anemia, easy bruising or bleeding SKIN: No rash or lesion. MUSCULOSKELETAL: No joint pain or arthritis.   NEUROLOGIC: No tingling, numbness, weakness.  PSYCHIATRY: No anxiety or depression.   MEDICATIONS AT HOME:  Prior to Admission medications   Medication Sig Start Date End Date Taking? Authorizing Provider  aspirin 81 MG chewable tablet Chew 324 mg by mouth once.   Yes Historical Provider, MD  fluticasone (FLONASE) 50 MCG/ACT nasal spray Place 2 sprays into both nostrils daily.    Yes Historical Provider, MD  loratadine (CLARITIN) 10 MG tablet Take 10 mg by mouth daily.   Yes Historical Provider, MD  naproxen (NAPROSYN) 375 MG tablet Take 375 mg by mouth 2 (two) times daily with a  meal.   Yes Historical Provider, MD      PHYSICAL EXAMINATION:   VITAL SIGNS: Blood pressure 151/90, pulse 57, temperature 98 F (36.7 C), temperature source Oral, resp. rate 18, height 5\' 9"  (1.753 m), weight 88.905 kg (196 lb), SpO2 100 %.  GENERAL:  55 y.o.-year-old patient lying in the bed with no acute distress.  EYES: Pupils equal, round, reactive to light and accommodation. No scleral icterus. Extraocular muscles intact.  HEENT: Head atraumatic, normocephalic. Oropharynx and nasopharynx  clear.  NECK:  Supple, no jugular venous distention. No thyroid enlargement, no tenderness.  LUNGS: Normal breath sounds bilaterally, no wheezing, rales,rhonchi or crepitation. No use of accessory muscles of respiration.  CARDIOVASCULAR: S1, S2 normal. No murmurs, rubs, or gallops.  ABDOMEN: Soft, nontender, nondistended. Bowel sounds present. No organomegaly or mass.  EXTREMITIES: No pedal edema, cyanosis, or clubbing.  NEUROLOGIC: Cranial nerves II through XII are intact. Muscle strength 5/5 in all extremities. Sensation intact. Gait not checked.  PSYCHIATRIC: The patient is alert and oriented x 3.  SKIN: No obvious rash, lesion, or ulcer.   LABORATORY PANEL:   CBC  Recent Labs Lab 01/27/16 2006 01/31/16 1133  WBC 11.6* 8.9  HGB 14.2 14.2  HCT 40.9 40.3  PLT 214 204  MCV 88.5 88.0  MCH 30.7 31.1  MCHC 34.7 35.3  RDW 12.9 12.8   ------------------------------------------------------------------------------------------------------------------  Chemistries   Recent Labs Lab 01/27/16 2006 01/31/16 1133  NA 138 139  K 3.5 3.7  CL 106 108  CO2 26 27  GLUCOSE 98 98  BUN 22* 20  CREATININE 0.95 0.88  CALCIUM 8.8* 8.9  AST 23  --   ALT 23  --   ALKPHOS 58  --   BILITOT 0.7  --    ------------------------------------------------------------------------------------------------------------------ estimated creatinine clearance is 105.9 mL/min (by C-G formula based on Cr of 0.88). ------------------------------------------------------------------------------------------------------------------ No results for input(s): TSH, T4TOTAL, T3FREE, THYROIDAB in the last 72 hours.  Invalid input(s): FREET3   Coagulation profile No results for input(s): INR, PROTIME in the last 168 hours. ------------------------------------------------------------------------------------------------------------------- No results for input(s): DDIMER in the last 72  hours. -------------------------------------------------------------------------------------------------------------------  Cardiac Enzymes  Recent Labs Lab 01/27/16 2006 01/31/16 1133  TROPONINI <0.03 0.04*   ------------------------------------------------------------------------------------------------------------------ Invalid input(s): POCBNP  ---------------------------------------------------------------------------------------------------------------  Urinalysis No results found for: COLORURINE, APPEARANCEUR, LABSPEC, PHURINE, GLUCOSEU, HGBUR, BILIRUBINUR, KETONESUR, PROTEINUR, UROBILINOGEN, NITRITE, LEUKOCYTESUR   RADIOLOGY: No results found.  EKG: Orders placed or performed during the hospital encounter of 01/31/16  . EKG 12-Lead  . EKG 12-Lead  . ED EKG within 10 minutes  . ED EKG within 10 minutes    IMPRESSION AND PLAN: Patient is a 55 year old white male with history of seasonal allergies early coronary artery disease in the family presents with recurrent chest pain  1. Chest pain Will admit patient for observation serial cardiac enzymes., I will obtain a CT scan of the chest make sure there is nothing else going on. We will ask cardiology to see. I will place him on PPIs. Further workup based on cardiology.  2. Seasonal allergies    All the records are reviewed and case discussed with ED provider. Management plans discussed with the patient, family and they are in agreement.  CODE STATUS:    Code Status Orders        Start     Ordered   01/31/16 1518  Full code   Continuous     01/31/16 1517    Code Status History    Date  Active Date Inactive Code Status Order ID Comments User Context   This patient has a current code status but no historical code status.       TOTAL TIME TAKING CARE OF THIS PATIENT: .    Auburn Bilberry M.D on 01/31/2016 at 3:37 PM  Between 7am to 6pm - Pager - [phone]  After 6pm go to [email or web address] -  password [email or web address] [facility]  [facility] [place] [facility]  Office  [phone]  CC: Primary care physician; [facility], Madaline Guthrie, MD

## 2016-01-31 NOTE — Progress Notes (Signed)
Patient alert and oriented x4. Oriented to room, unit, and call bell. Admission completed. No complaints at this time. Will cont to assess. Skin assessment verified by Gloris ManchesterLeah Lee, RN. Telemetry box verified. With SpringfieldJulisa, NT. Trudee KusterBrandi R Mansfield

## 2016-02-01 LAB — BASIC METABOLIC PANEL
ANION GAP: 5 (ref 5–15)
BUN: 19 mg/dL (ref 6–20)
CHLORIDE: 107 mmol/L (ref 101–111)
CO2: 27 mmol/L (ref 22–32)
Calcium: 8.7 mg/dL — ABNORMAL LOW (ref 8.9–10.3)
Creatinine, Ser: 0.98 mg/dL (ref 0.61–1.24)
GFR calc Af Amer: >60 mL/min (ref >60)
GLUCOSE: 92 mg/dL (ref 65–99)
POTASSIUM: 3.8 mmol/L (ref 3.5–5.1)
Sodium: 139 mmol/L (ref 135–145)

## 2016-02-01 LAB — CBC
HCT: 38.6 % — ABNORMAL LOW (ref 40.0–52.0)
HEMOGLOBIN: 13.5 g/dL (ref 13.0–18.0)
MCH: 30.5 pg (ref 26.0–34.0)
MCHC: 34.9 g/dL (ref 32.0–36.0)
MCV: 87.2 fL (ref 80.0–100.0)
PLATELETS: 191 10*3/uL (ref 150–440)
RBC: 4.42 MIL/uL (ref 4.40–5.90)
RDW: 12.7 % (ref 11.5–14.5)
WBC: 9.4 10*3/uL (ref 3.8–10.6)

## 2016-02-01 LAB — TROPONIN I: Troponin I: 0.08 ng/mL — ABNORMAL HIGH (ref <0.031)

## 2016-02-01 MED ORDER — METOPROLOL TARTRATE 25 MG PO TABS
12.5000 mg | ORAL_TABLET | Freq: Two times a day (BID) | ORAL | Status: DC
  Administered 2016-02-01 – 2016-02-03 (×4): 12.5 mg via ORAL
  Filled 2016-02-01 (×4): qty 1

## 2016-02-01 MED ORDER — NITROGLYCERIN 0.3 MG SL SUBL
0.3000 mg | SUBLINGUAL_TABLET | SUBLINGUAL | Status: DC | PRN
  Filled 2016-02-01: qty 100

## 2016-02-01 MED ORDER — NITROGLYCERIN 0.4 MG SL SUBL: 0.4000 mg | SUBLINGUAL_TABLET | SUBLINGUAL | Status: DC | PRN

## 2016-02-01 NOTE — Consult Note (Signed)
[facility]  CARDIOLOGY CONSULT NOTE  Patient ID: Brian HeroDouglas J Waters MRN: [id number] DOB/AGE: 1960/11/25 55 y.o.  Admit date: 01/31/2016 Referring Physician Dr. Cherlynn KaiserSainani Primary Physician   Primary Cardiologist Dr. Gwen PoundsKowalski Reason for Consultation [place]  HPI: A 55 year old male with no prior cardiac history who was admitted with exertional chest pain followed by rest chest pain. Patient has a strong family history of heart disease. He has an LDL of 110 with an HDL of 34. Patient denies tobacco use but has a brother who had a PCI in his 3650s as well as a grandfather who had his initial cardiac coronary artery disease in his 1150s and underwent coronary artery bypass grafting in his 6370s. Patient is not aware of hypertension or diabetes. For the past week he has noted exertional chest pain which worsened late last week. He was seen by Dr. Gwen PoundsKowalski in the office and underwent an ETT which was read as normal. Yesterday the patient had several episodes of exertional chest pain described as midsternal pain radiating towards the left side of his chest with exertion. It resolved with rest. This happened on several occasions and he returned home where he had an episode of rest chest pain. He presented to the emergency room for evaluation where his electrocardiogram was normal. His initial serum troponin was 0.04 with the second level is 0.09 followed by 0.08. He is currently pain-free.  Review of Systems  Constitutional: Negative.   HENT: Negative.   Eyes: Negative.   Respiratory: Negative.   Cardiovascular: Positive for chest pain.  Gastrointestinal: Negative.   Genitourinary: Negative.   Musculoskeletal: Positive for joint pain.  Skin: Negative.   Neurological: Negative.   Endo/Heme/Allergies: Negative.   Psychiatric/Behavioral: Negative.     Past Medical History  Diagnosis Date  . Seasonal allergies     Family History  Problem Relation Age of Onset  . CAD  Father     Social History   Social History  . Marital Status: Married    Spouse Name: N/A  . Number of Children: N/A  . Years of Education: N/A   Occupational History  . Not on file.   Social History Main Topics  . Smoking status: Never Smoker   . Smokeless tobacco: Not on file  . Alcohol Use: Yes  . Drug Use: Not on file  . Sexual Activity: Not on file   Other Topics Concern  . Not on file   Social History Narrative    Past Surgical History  Procedure Laterality Date  . Hernia repair    . Shoulder arthroscopy    . Cyst excision       Prescriptions prior to admission  Medication Sig Dispense Refill Last Dose  . aspirin 81 MG chewable tablet Chew 324 mg by mouth once.   01/31/2016 at Unknown time  . fluticasone (FLONASE) 50 MCG/ACT nasal spray Place 2 sprays into both nostrils daily.    01/31/2016 at Unknown time  . loratadine (CLARITIN) 10 MG tablet Take 10 mg by mouth daily.   01/31/2016 at Unknown time  . naproxen (NAPROSYN) 375 MG tablet Take 375 mg by mouth 2 (two) times daily with a meal.   01/30/2016 at Unknown time    Physical Exam: Blood pressure 122/80, pulse 73, temperature 98.5 F (36.9 C), temperature source Oral, resp. rate 20, height 5\' 9"  (1.753 m), weight 89.449 kg (197 lb 3.2 oz), SpO2 95 %.   Wt Readings from Last 1 Encounters:  01/31/16  89.449 kg (197 lb 3.2 oz)     General appearance: alert and cooperative Head: Normocephalic, without obvious abnormality, atraumatic Neck: no adenopathy, no carotid bruit, no JVD, supple, symmetrical, trachea midline and thyroid not enlarged, symmetric, no tenderness/mass/nodules Resp: clear to auscultation bilaterally Chest wall: no tenderness Cardio: regular rate and rhythm GI: soft, non-tender; bowel sounds normal; no masses,  no organomegaly Extremities: extremities normal, atraumatic, no cyanosis or edema Pulses: 2+ and symmetric Neurologic: Grossly normal  Labs:   Lab Results  Component Value Date    WBC 9.4 02/01/2016   HGB 13.5 02/01/2016   HCT 38.6* 02/01/2016   MCV 87.2 02/01/2016   PLT 191 02/01/2016    Recent Labs Lab 01/27/16 2006  02/01/16 0537  NA 138  < > 139  K 3.5  < > 3.8  CL 106  < > 107  CO2 26  < > 27  BUN 22*  < > 19  CREATININE 0.95  < > 0.98  CALCIUM 8.8*  < > 8.7*  PROT 7.4  --   --   BILITOT 0.7  --   --   ALKPHOS 58  --   --   ALT 23  --   --   AST 23  --   --   GLUCOSE 98  < > 92  < > = values in this interval not displayed. Lab Results  Component Value Date   TROPONINI 0.08* 02/01/2016      Radiology: Chest x-ray showed no acute cardiopulmonary disease. Chest CT showed no pulmonary embolus with no acute infiltrate or pulmonary edema. There were no mediastinal lymph nodes pleural or pericardial effusion. No gallstones were noted in the gallbladder. Pancreas spleen and adrenal glands were unremarkable as were the liver and kidneys.  EKG: Normal sinus rhythm with no ischemia  ASSESSMENT AND PLAN:  55 year old with no history of heart disease who was admitted with exertional chest pain. He is at a borderline serum troponin of 0.09. Electrocardiogram is unremarkable. Patient had a negative ETT 2 days ago but had recurrent exertional chest pain followed by rest chest pain and a mild serum troponin elevation. Has strong risk factors for heart disease include strong family history as well as hyperlipidemia. Chest CT was unremarkable as was chest x-ray. Based on his current exertional symptoms as well as rest pains consistent with unstable angina, we'll need to proceed with left cardiac catheterization to evaluate coronary anatomy. Risks and benefits of this procedure were discussed with the patient and his wife and they agree to proceed. Signed: Dalia Heading MD, [facility] 02/01/2016, 9:20 AM

## 2016-02-01 NOTE — Progress Notes (Signed)
Patient provided written and verbal education on heart catheterization scheduled for tomorrow, verbalizes understanding. No questions at this time, consent obtained. Trudee KusterBrandi R Mansfield

## 2016-02-01 NOTE — Progress Notes (Signed)
[facility] - [place] at [facility]   PATIENT NAME: Brian Waters    MR#:  [id number]  DATE OF BIRTH:  Feb 04, 1961  SUBJECTIVE:   Patient here due to persistent chest pain and unstable angina. Cardiac markers with minimal elevation. Patient is currently chest pain-free. Going for cardiac catheterization tomorrow  REVIEW OF SYSTEMS:    Review of Systems  Constitutional: Negative for fever and chills.  HENT: Negative for congestion and tinnitus.   Eyes: Negative for blurred vision and double vision.  Respiratory: Negative for cough, shortness of breath and wheezing.   Cardiovascular: Negative for chest pain, orthopnea and PND.  Gastrointestinal: Negative for nausea, vomiting, abdominal pain and diarrhea.  Genitourinary: Negative for dysuria and hematuria.  Neurological: Negative for dizziness, sensory change and focal weakness.  All other systems reviewed and are negative.   Nutrition: Heart Healthy Tolerating Diet: Yes Tolerating PT: Ambulatory     DRUG ALLERGIES:   Allergies  Allergen Reactions  . Ibuprofen Diarrhea  . Codeine Rash  . Penicillins Rash    Has patient had a PCN reaction causing immediate rash, facial/tongue/throat swelling, SOB or lightheadedness with hypotension: Yes Has patient had a PCN reaction causing severe rash involving mucus membranes or skin necrosis: No Has patient had a PCN reaction that required hospitalization No Has patient had a PCN reaction occurring within the last 10 years: No If all of the above answers are "NO", then may proceed with Cephalosporin use.     VITALS:  Blood pressure 137/83, pulse 68, temperature 98.5 F (36.9 C), temperature source Oral, resp. rate 20, height  (1.753 m), weight 89.449 kg (197 lb 3.2 oz), SpO2 95 %.  PHYSICAL EXAMINATION:   Physical Exam  GENERAL:  55 y.o.-year-old patient lying in the bed with no acute distress.  EYES: Pupils equal, round, reactive to light and  accommodation. No scleral icterus. Extraocular muscles intact.  HEENT: Head atraumatic, normocephalic. Oropharynx and nasopharynx clear.  NECK:  Supple, no jugular venous distention. No thyroid enlargement, no tenderness.  LUNGS: Normal breath sounds bilaterally, no wheezing, rales, rhonchi. No use of accessory muscles of respiration.  CARDIOVASCULAR: S1, S2 normal. No murmurs, rubs, or gallops.  ABDOMEN: Soft, nontender, nondistended. Bowel sounds present. No organomegaly or mass.  EXTREMITIES: No cyanosis, clubbing or edema b/l.    NEUROLOGIC: Cranial nerves II through XII are intact. No focal Motor or sensory deficits b/l.   PSYCHIATRIC: The patient is alert and oriented x 3.  SKIN: No obvious rash, lesion, or ulcer.    LABORATORY PANEL:   CBC  Recent Labs Lab 02/01/16 0537  WBC 9.4  HGB 13.5  HCT 38.6*  PLT 191   ------------------------------------------------------------------------------------------------------------------  Chemistries   Recent Labs Lab 01/27/16 2006  02/01/16 0537  NA 138  < > 139  K 3.5  < > 3.8  CL 106  < > 107  CO2 26  < > 27  GLUCOSE 98  < > 92  BUN 22*  < > 19  CREATININE 0.95  < > 0.98  CALCIUM 8.8*  < > 8.7*  AST 23  --   --   ALT 23  --   --   ALKPHOS 58  --   --   BILITOT 0.7  --   --   < > = values in this interval not displayed. ------------------------------------------------------------------------------------------------------------------  Cardiac Enzymes  Recent Labs Lab 02/01/16 0537  TROPONINI 0.08*   ------------------------------------------------------------------------------------------------------------------  RADIOLOGY:  Ct Angio Chest Pe  W/cm &/or Wo Cm  01/31/2016  CLINICAL DATA:  Shortness of Breath EXAM: CT ANGIOGRAPHY CHEST WITH CONTRAST TECHNIQUE: Multidetector CT imaging of the chest was performed using the standard protocol during bolus administration of intravenous contrast. Multiplanar CT image  reconstructions and MIPs were obtained to evaluate the vascular anatomy. CONTRAST:  75 cc Isovue COMPARISON:  CT chest 05/09/2006 FINDINGS: Mediastinum/Lymph Nodes: Images of the thoracic inlet are unremarkable. Central airways are patent. No mediastinal hematoma or adenopathy. Heart size within normal limits. No pericardial effusion. The study is of excellent technical quality. No pulmonary embolus is noted. Lungs/Pleura: Images of the lung parenchyma shows no acute infiltrate or pleural effusion. No pulmonary edema. Mild dependent atelectasis noted bilateral lower lobe. There is no bronchiectasis. No focal consolidation. No pneumothorax. No fibrotic changes. Upper abdomen: The visualized liver is unremarkable. The visualized pancreas, spleen and adrenal glands are unremarkable. No calcified gallstones are noted within gallbladder. Visualized upper kidneys are unremarkable. Musculoskeletal: Sagittal images of the spine shows mild degenerative changes upper thoracic spine. Sagittal view of the sternum is unremarkable. No destructive rib lesions are noted. Review of the MIP images confirms the above findings. IMPRESSION: 1. No pulmonary embolus is noted. 2. No acute infiltrate or pulmonary edema. Dependent atelectasis bilateral lower lobe posteriorly. 3. No mediastinal hematoma or adenopathy. 4. No pleural or pericardial effusion. Electronically Signed   By: Natasha MeadLiviu  Pop M.D.   On: 01/31/2016 17:22     ASSESSMENT AND PLAN:   55 year old male with no significant past medical history, who presents to the hospital with ongoing chest pain, shortness of breath.  #1 chest pain/unstable angina-patient has had persistent chest pain and exertional dyspnea. He had a negative stress test this past week but continues to have acute symptoms. -His cardiac markers are essentially benign. He does have a significant family history of heart disease. -Appreciate cardiology input and patient going for cardiac catheterization  tomorrow morning. -Continue aspirin, metoprolol, nitroglycerin, morphine, oxygen.  #2 seasonal allergies-no acute issue.    All the records are reviewed and case discussed with Care Management/Social Workerr. Management plans discussed with the patient, family and they are in agreement.  CODE STATUS: Full  DVT Prophylaxis: Lovenox  TOTAL TIME TAKING CARE OF THIS PATIENT: 30 minutes.   POSSIBLE D/C IN 1-2 DAYS, DEPENDING ON CLINICAL CONDITION.   [place] SirenSAINANI,Sindee Stucker J M.D on 02/01/2016 at 3:16 PM  Between 7am to 6pm - Pager - [phone]  After 6pm go to [email or web address] - password [email or web address] [facility]  [facility]  Office  [phone]  CC: Primary care physician; [facility] Care[facility]ELD[facility]US[facility], Madaline GuthrieALE E, MD

## 2016-02-02 ENCOUNTER — Encounter
Admission: EM | Disposition: A | Discharge status: Home / Self Care | Payer: Self-pay | Source: Home / Self Care | Attending: Specialist

## 2016-02-02 DIAGNOSIS — Z880 Allergy status to penicillin: Secondary | ICD-10-CM | POA: Diagnosis not present

## 2016-02-02 DIAGNOSIS — I214 Non-ST elevation (NSTEMI) myocardial infarction: Secondary | ICD-10-CM | POA: Diagnosis present

## 2016-02-02 DIAGNOSIS — Z7982 Long term (current) use of aspirin: Secondary | ICD-10-CM | POA: Diagnosis not present

## 2016-02-02 DIAGNOSIS — Z8249 Family history of ischemic heart disease and other diseases of the circulatory system: Secondary | ICD-10-CM | POA: Diagnosis not present

## 2016-02-02 DIAGNOSIS — R0602 Shortness of breath: Secondary | ICD-10-CM | POA: Diagnosis present

## 2016-02-02 DIAGNOSIS — Z7951 Long term (current) use of inhaled steroids: Secondary | ICD-10-CM | POA: Diagnosis not present

## 2016-02-02 DIAGNOSIS — I25110 Atherosclerotic heart disease of native coronary artery with unstable angina pectoris: Secondary | ICD-10-CM | POA: Diagnosis present

## 2016-02-02 DIAGNOSIS — J302 Other seasonal allergic rhinitis: Secondary | ICD-10-CM | POA: Diagnosis present

## 2016-02-02 DIAGNOSIS — Z885 Allergy status to narcotic agent status: Secondary | ICD-10-CM | POA: Diagnosis not present

## 2016-02-02 HISTORY — PX: CARDIAC CATHETERIZATION: SHX172

## 2016-02-02 LAB — PROTIME-INR
INR: 1.05
PROTHROMBIN TIME: 13.9 s (ref 11.4–15.0)

## 2016-02-02 SURGERY — LEFT HEART CATH AND CORONARY ANGIOGRAPHY
Anesthesia: Moderate Sedation

## 2016-02-02 MED ORDER — ASPIRIN 81 MG PO CHEW
CHEWABLE_TABLET | ORAL | Status: DC | PRN
  Administered 2016-02-02: 324 mg via ORAL

## 2016-02-02 MED ORDER — TICAGRELOR 90 MG PO TABS
90.0000 mg | ORAL_TABLET | Freq: Two times a day (BID) | ORAL | Status: DC
  Administered 2016-02-02 – 2016-02-03 (×2): 90 mg via ORAL
  Filled 2016-02-02 (×2): qty 1

## 2016-02-02 MED ORDER — SODIUM CHLORIDE 0.9% FLUSH: 3.0000 mL | INTRAVENOUS | Status: DC | PRN

## 2016-02-02 MED ORDER — SODIUM CHLORIDE 0.9 % WEIGHT BASED INFUSION
3.0000 mL/kg/h | INTRAVENOUS | Status: DC
  Administered 2016-02-02: 3 mL/kg/h via INTRAVENOUS

## 2016-02-02 MED ORDER — ACETAMINOPHEN 325 MG PO TABS: 650.0000 mg | ORAL_TABLET | ORAL | Status: DC | PRN

## 2016-02-02 MED ORDER — NITROGLYCERIN 5 MG/ML IV SOLN
INTRAVENOUS | Status: AC
  Filled 2016-02-02: qty 10

## 2016-02-02 MED ORDER — ONDANSETRON HCL 4 MG/2ML IJ SOLN: 4.0000 mg | Freq: Four times a day (QID) | INTRAMUSCULAR | Status: DC | PRN

## 2016-02-02 MED ORDER — ASPIRIN 81 MG PO CHEW
81.0000 mg | CHEWABLE_TABLET | Freq: Every day | ORAL | Status: DC
  Administered 2016-02-03: 81 mg via ORAL
  Filled 2016-02-02: qty 1

## 2016-02-02 MED ORDER — OXYCODONE-ACETAMINOPHEN 5-325 MG PO TABS: 1.0000 | ORAL_TABLET | ORAL | Status: DC | PRN

## 2016-02-02 MED ORDER — SODIUM CHLORIDE 0.9 % WEIGHT BASED INFUSION
1.0000 mL/kg/h | INTRAVENOUS | Status: DC
Start: 2016-02-03 — End: 2016-02-02
  Administered 2016-02-02: 1 mL/kg/h via INTRAVENOUS

## 2016-02-02 MED ORDER — SODIUM CHLORIDE 0.9% FLUSH: 3.0000 mL | Freq: Two times a day (BID) | INTRAVENOUS | Status: DC

## 2016-02-02 MED ORDER — MIDAZOLAM HCL 2 MG/2ML IJ SOLN
INTRAMUSCULAR | Status: AC
  Filled 2016-02-02: qty 2

## 2016-02-02 MED ORDER — ATORVASTATIN CALCIUM 20 MG PO TABS
40.0000 mg | ORAL_TABLET | Freq: Every day | ORAL | Status: DC
  Administered 2016-02-02: 40 mg via ORAL
  Filled 2016-02-02: qty 1
  Filled 2016-02-02: qty 2

## 2016-02-02 MED ORDER — TICAGRELOR 90 MG PO TABS
ORAL_TABLET | ORAL | Status: AC
  Filled 2016-02-02: qty 2

## 2016-02-02 MED ORDER — HEPARIN (PORCINE) IN NACL 2-0.9 UNIT/ML-% IJ SOLN
INTRAMUSCULAR | Status: AC
  Filled 2016-02-02: qty 500

## 2016-02-02 MED ORDER — SODIUM CHLORIDE 0.9 % WEIGHT BASED INFUSION
3.0000 mL/kg/h | INTRAVENOUS | Status: AC
  Administered 2016-02-02: 3 mL/kg/h via INTRAVENOUS

## 2016-02-02 MED ORDER — TICAGRELOR 90 MG PO TABS
ORAL_TABLET | ORAL | Status: DC | PRN
  Administered 2016-02-02: 180 mg via ORAL

## 2016-02-02 MED ORDER — NITROGLYCERIN 1 MG/10 ML FOR IR/CATH LAB
INTRA_ARTERIAL | Status: DC | PRN
  Administered 2016-02-02 (×3): 200 ug via INTRACORONARY

## 2016-02-02 MED ORDER — IOPAMIDOL (ISOVUE-300) INJECTION 61%
INTRAVENOUS | Status: DC | PRN
  Administered 2016-02-02: 115 mL via INTRA_ARTERIAL
  Administered 2016-02-02: 180 mL via INTRA_ARTERIAL

## 2016-02-02 MED ORDER — BIVALIRUDIN 250 MG IV SOLR
INTRAVENOUS | Status: AC
  Filled 2016-02-02: qty 250

## 2016-02-02 MED ORDER — BIVALIRUDIN BOLUS VIA INFUSION - CUPID
INTRAVENOUS | Status: DC | PRN
  Administered 2016-02-02: 64.125 mg via INTRAVENOUS

## 2016-02-02 MED ORDER — SODIUM CHLORIDE 0.9 % IV SOLN: 250.0000 mL | INTRAVENOUS | Status: DC | PRN

## 2016-02-02 MED ORDER — SODIUM CHLORIDE 0.9 % IV SOLN
250.0000 mg | INTRAVENOUS | Status: DC | PRN
  Administered 2016-02-02: 1.75 mg/kg/h via INTRAVENOUS

## 2016-02-02 MED ORDER — SODIUM CHLORIDE 0.9 % IV SOLN
0.2500 mg/kg/h | INTRAVENOUS | Status: AC
  Filled 2016-02-02: qty 250

## 2016-02-02 MED ORDER — FENTANYL CITRATE (PF) 100 MCG/2ML IJ SOLN
INTRAMUSCULAR | Status: DC | PRN
  Administered 2016-02-02 (×3): 25 ug via INTRAVENOUS

## 2016-02-02 MED ORDER — ASPIRIN 81 MG PO CHEW
CHEWABLE_TABLET | ORAL | Status: AC
  Filled 2016-02-02: qty 4

## 2016-02-02 MED ORDER — MIDAZOLAM HCL 2 MG/2ML IJ SOLN
INTRAMUSCULAR | Status: DC | PRN
  Administered 2016-02-02 (×2): 0.5 mg via INTRAVENOUS
  Administered 2016-02-02 (×2): 1 mg via INTRAVENOUS

## 2016-02-02 MED ORDER — FENTANYL CITRATE (PF) 100 MCG/2ML IJ SOLN
INTRAMUSCULAR | Status: AC
  Filled 2016-02-02: qty 2

## 2016-02-02 SURGICAL SUPPLY — 18 items
BALLN TREK RX 2.5X15 (BALLOONS) ×4
BALLOON TREK RX 2.5X15 (BALLOONS) ×2 IMPLANT
CATH INFINITI 5FR ANG PIGTAIL (CATHETERS) ×4 IMPLANT
CATH INFINITI 5FR JL4 (CATHETERS) ×4 IMPLANT
CATH INFINITI JR4 5F (CATHETERS) ×4 IMPLANT
CATH VISTA GUIDE 6FR XB3.5 SH (CATHETERS) ×4 IMPLANT
DEVICE CLOSURE MYNXGRIP 6/7F (Vascular Products) ×4 IMPLANT
DEVICE INFLAT 30 PLUS (MISCELLANEOUS) ×4 IMPLANT
FEM STOP ARCH (HEMOSTASIS) ×2
KIT MANI 3VAL PERCEP (MISCELLANEOUS) ×4 IMPLANT
NEEDLE PERC 18GX7CM (NEEDLE) ×4 IMPLANT
PACK CARDIAC CATH (CUSTOM PROCEDURE TRAY) ×4 IMPLANT
SHEATH AVANTI 6FR X 11CM (SHEATH) ×4 IMPLANT
SHEATH PINNACLE 5F 10CM (SHEATH) ×4 IMPLANT
STENT XIENCE ALPINE RX 3.0X28 (Permanent Stent) ×4 IMPLANT
SYSTEM COMPRESSION FEMOSTOP (HEMOSTASIS) ×2 IMPLANT
WIRE EMERALD 3MM-J .035X150CM (WIRE) ×4 IMPLANT
WIRE G HI TQ BMW 190 (WIRE) ×4 IMPLANT

## 2016-02-02 NOTE — Progress Notes (Signed)
Patient does not have ASA chewable 81 mg to take in the morning for cath. Dr. Anne HahnWillis notified if he would prescribed the ASA 81 mg chewable. No new order received, he stated that the patient should be fine with the ASA 325 mg  EC  and as such the RN should move  and administer in the morning.  No acute chest pain reported by the patient. No signs/symptoms of  pain noted. Patient is kept NPO after midnight.  Bilateral groins shaved. Will continue to monitor.

## 2016-02-02 NOTE — Progress Notes (Signed)
[facility]  SUBJECTIVE: No further chest pain   Filed Vitals:   02/02/16 0413 02/02/16 0553 02/02/16 0811 02/02/16 0834  BP: 112/68  141/92 140/96  Pulse: 63  62 60  Temp: 98.4 F (36.9 C)  98 F (36.7 C) 98.3 F (36.8 C)  TempSrc: Oral  Oral   Resp: 20  18 20   Height:      Weight:  85.548 kg (188 lb 9.6 oz)    SpO2: 96%  100% 100%    Intake/Output Summary (Last 24 hours) at 02/02/16 0939 Last data filed at 02/02/16 0655  Gross per 24 hour  Intake  748.2 ml  Output   3250 ml  Net -2501.8 ml    LABS: Basic Metabolic Panel:  Recent Labs  16/08/9602/25/17 1133 02/01/16 0537  NA 139 139  K 3.7 3.8  CL 108 107  CO2 27 27  GLUCOSE 98 92  BUN 20 19  CREATININE 0.88 0.98  CALCIUM 8.9 8.7*   Liver Function Tests: No results for input(s): AST, ALT, ALKPHOS, BILITOT, PROT, ALBUMIN in the last 72 hours. No results for input(s): LIPASE, AMYLASE in the last 72 hours. CBC:  Recent Labs  01/31/16 1133 02/01/16 0537  WBC 8.9 9.4  HGB 14.2 13.5  HCT 40.3 38.6*  MCV 88.0 87.2  PLT 204 191   Cardiac Enzymes:  Recent Labs  01/31/16 1735 01/31/16 2321 02/01/16 0537  TROPONINI 0.08* 0.09* 0.08*   BNP: Invalid input(s): POCBNP D-Dimer: No results for input(s): DDIMER in the last 72 hours. Hemoglobin A1C: No results for input(s): HGBA1C in the last 72 hours. Fasting Lipid Panel: No results for input(s): CHOL, HDL, LDLCALC, TRIG, CHOLHDL, LDLDIRECT in the last 72 hours. Thyroid Function Tests: No results for input(s): TSH, T4TOTAL, T3FREE, THYROIDAB in the last 72 hours.  Invalid input(s): FREET3 Anemia Panel: No results for input(s): VITAMINB12, FOLATE, FERRITIN, TIBC, IRON, RETICCTPCT in the last 72 hours.   Physical Exam: Blood pressure 140/96, pulse 60, temperature 98.3 F (36.8 C), temperature source Oral, resp. rate 20, height 5\' 9"  (1.753 m), weight 85.548 kg (188 lb 9.6 oz), SpO2 100 %.   Wt Readings from Last 1  Encounters:  02/02/16 85.548 kg (188 lb 9.6 oz)     General appearance: alert and cooperative Head: Normocephalic, without obvious abnormality, atraumatic Cardio: regular rate and rhythm GI: soft, non-tender; bowel sounds normal; no masses,  no organomegaly Extremities: extremities normal, atraumatic, no cyanosis or edema Neurologic: Grossly normal  TELEMETRY: Reviewed telemetry pt in nsr:  ASSESSMENT AND PLAN:  Active Problems:   Chest pain-ruled in for small nstemi. Cath revealed normal lv funciton with normal lm, 90% proximal lad, insignificant disease in lcx and rca. Referred for pci of lad. Will place on asa 81 mg daily, brilinta 90 bid; metoprolol 12.5 tartrate bid; atorvastatin 40 mg daily. EF greater than 40%. Blood pressure normal so will defer ace i.     Dalia HeadingFATH,Deisi Salonga A., MD, [facility] 02/02/2016 9:39 AM

## 2016-02-02 NOTE — OR Nursing (Signed)
1055 pt came out of cath lab with femstop on at pressure of169. No DP Pulse felt, cath lab decrease to 69 . PT pulse faint no dp on right. Gradualy adjusted to obtain pulse, no mmHG to 0 and when actual femstop "girth"  Loosen, DP& PT pulse return to 2+. Dr. Lady GaryFath aware and femstop removed completely at 12noon and area dressed with guaze and tegaderm

## 2016-02-02 NOTE — Progress Notes (Signed)
[facility] [place] at [facility]   PATIENT NAME: Brian RummageDouglas Waters    MR#:  [id number]  DATE OF BIRTH:  August 25, 1961  SUBJECTIVE:   Patient here due to persistent chest pain and unstable angina. S/p cardiac cath showing a 90% proximal LAD lesion and s/p drug eluting stent placement.  No Chest pain presently.   REVIEW OF SYSTEMS:    Review of Systems  Constitutional: Negative for fever and chills.  HENT: Negative for congestion and tinnitus.   Eyes: Negative for blurred vision and double vision.  Respiratory: Negative for cough, shortness of breath and wheezing.   Cardiovascular: Negative for chest pain, orthopnea and PND.  Gastrointestinal: Negative for nausea, vomiting, abdominal pain and diarrhea.  Genitourinary: Negative for dysuria and hematuria.  Neurological: Negative for dizziness, sensory change and focal weakness.  All other systems reviewed and are negative.   Nutrition: Heart Healthy Tolerating Diet: Yes Tolerating PT: Ambulatory    DRUG ALLERGIES:   Allergies  Allergen Reactions  . Ibuprofen Diarrhea  . Codeine Rash  . Penicillins Rash    Has patient had a PCN reaction causing immediate rash, facial/tongue/throat swelling, SOB or lightheadedness with hypotension: Yes Has patient had a PCN reaction causing severe rash involving mucus membranes or skin necrosis: No Has patient had a PCN reaction that required hospitalization No Has patient had a PCN reaction occurring within the last 10 years: No If all of the above answers are "NO", then may proceed with Cephalosporin use.     VITALS:  Blood pressure 121/80, pulse 65, temperature 98 F (36.7 C), temperature source Oral, resp. rate 18, height 5\' 9"  (1.753 m), weight 85.548 kg (188 lb 9.6 oz), SpO2 100 %.  PHYSICAL EXAMINATION:   Physical Exam  GENERAL:  55 y.o.-year-old patient lying in the bed with no acute distress.  EYES: Pupils equal, round, reactive to light and  accommodation. No scleral icterus. Extraocular muscles intact.  HEENT: Head atraumatic, normocephalic. Oropharynx and nasopharynx clear.  NECK:  Supple, no jugular venous distention. No thyroid enlargement, no tenderness.  LUNGS: Normal breath sounds bilaterally, no wheezing, rales, rhonchi. No use of accessory muscles of respiration.  CARDIOVASCULAR: S1, S2 normal. No murmurs, rubs, or gallops.  ABDOMEN: Soft, nontender, nondistended. Bowel sounds present. No organomegaly or mass.  EXTREMITIES: No cyanosis, clubbing or edema b/l.    NEUROLOGIC: Cranial nerves II through XII are intact. No focal Motor or sensory deficits b/l.   PSYCHIATRIC: The patient is alert and oriented x 3.  SKIN: No obvious rash, lesion, or ulcer.    LABORATORY PANEL:   CBC  Recent Labs Lab 02/01/16 0537  WBC 9.4  HGB 13.5  HCT 38.6*  PLT 191   ------------------------------------------------------------------------------------------------------------------  Chemistries   Recent Labs Lab 01/27/16 2006  02/01/16 0537  NA 138  < > 139  K 3.5  < > 3.8  CL 106  < > 107  CO2 26  < > 27  GLUCOSE 98  < > 92  BUN 22*  < > 19  CREATININE 0.95  < > 0.98  CALCIUM 8.8*  < > 8.7*  AST 23  --   --   ALT 23  --   --   ALKPHOS 58  --   --   BILITOT 0.7  --   --   < > = values in this interval not displayed. ------------------------------------------------------------------------------------------------------------------  Cardiac Enzymes  Recent Labs Lab 02/01/16 0537  TROPONINI 0.08*   ------------------------------------------------------------------------------------------------------------------  RADIOLOGY:  Ct Angio Chest Pe W/cm &/or Wo Cm  01/31/2016  CLINICAL DATA:  Shortness of Breath EXAM: CT ANGIOGRAPHY CHEST WITH CONTRAST TECHNIQUE: Multidetector CT imaging of the chest was performed using the standard protocol during bolus administration of intravenous contrast. Multiplanar CT image  reconstructions and MIPs were obtained to evaluate the vascular anatomy. CONTRAST:  75 cc Isovue COMPARISON:  CT chest 05/09/2006 FINDINGS: Mediastinum/Lymph Nodes: Images of the thoracic inlet are unremarkable. Central airways are patent. No mediastinal hematoma or adenopathy. Heart size within normal limits. No pericardial effusion. The study is of excellent technical quality. No pulmonary embolus is noted. Lungs/Pleura: Images of the lung parenchyma shows no acute infiltrate or pleural effusion. No pulmonary edema. Mild dependent atelectasis noted bilateral lower lobe. There is no bronchiectasis. No focal consolidation. No pneumothorax. No fibrotic changes. Upper abdomen: The visualized liver is unremarkable. The visualized pancreas, spleen and adrenal glands are unremarkable. No calcified gallstones are noted within gallbladder. Visualized upper kidneys are unremarkable. Musculoskeletal: Sagittal images of the spine shows mild degenerative changes upper thoracic spine. Sagittal view of the sternum is unremarkable. No destructive rib lesions are noted. Review of the MIP images confirms the above findings. IMPRESSION: 1. No pulmonary embolus is noted. 2. No acute infiltrate or pulmonary edema. Dependent atelectasis bilateral lower lobe posteriorly. 3. No mediastinal hematoma or adenopathy. 4. No pleural or pericardial effusion. Electronically Signed   By: Natasha Mead M.D.   On: 01/31/2016 17:22     ASSESSMENT AND PLAN:   55 year old male with no significant past medical history, who presents to the hospital with ongoing chest pain, shortness of breath.  #1 NSTEMI-patient has had persistent chest pain and exertional dyspnea. He had a negative stress test this past week but continued to have acute symptoms. - He does have a significant family history of heart disease. -Appreciate cardiology input and s/p Cardiac cath showing a 90% proximal LAD s/p drug eluting stent placement.   - cont. ASA, Brillinta,  Metoprolol, Nitro, O2.   #2 seasonal allergies-no acute issue.  D/c home tomorrow a.m.   All the records are reviewed and case discussed with Care Management/Social Workerr. Management plans discussed with the patient, family and they are in agreement.  CODE STATUS: Full  DVT Prophylaxis: Lovenox  TOTAL TIME TAKING CARE OF THIS PATIENT: 25 minutes.   POSSIBLE D/C IN 1-2 DAYS, DEPENDING ON CLINICAL CONDITION.   Houston Siren M.D on 02/02/2016 at 2:46 PM  Between 7am to 6pm - Pager - [phone]  After 6pm go to [email or web address] - password [email or web address] [facility]  [facility]  Office  [phone]  CC: Primary care physician; [facility], Madaline Guthrie, MD

## 2016-02-03 ENCOUNTER — Encounter: Payer: Self-pay | Admitting: Cardiology

## 2016-02-03 MED ORDER — ASPIRIN EC 81 MG PO TBEC: 81.0000 mg | DELAYED_RELEASE_TABLET | Freq: Every day | ORAL | Status: AC

## 2016-02-03 MED ORDER — METOPROLOL TARTRATE 25 MG PO TABS: 12.5000 mg | ORAL_TABLET | Freq: Two times a day (BID) | ORAL | Status: DC

## 2016-02-03 MED ORDER — ATORVASTATIN CALCIUM 40 MG PO TABS: 40.0000 mg | ORAL_TABLET | Freq: Every day | ORAL | Status: DC

## 2016-02-03 MED ORDER — TICAGRELOR 90 MG PO TABS: 90.0000 mg | ORAL_TABLET | Freq: Two times a day (BID) | ORAL | Status: DC

## 2016-02-03 NOTE — Care Management (Signed)
Met with patient at bedside. He lives at home with his wife. He is independent, active, drives and works. Denies issues obtaining medications, medical care or transportation. Started on Brilinta. Savings coupon given. No other needs noted. Case closed.

## 2016-02-03 NOTE — Progress Notes (Signed)
Patient walked around the nurses station 2 times this morning. Denied any chest pain. No acute respiratory distress noted. Right groin site is free of any hematoma/bleeding or any bruising.  Will continue to monitor.

## 2016-02-03 NOTE — Progress Notes (Signed)
[facility]  SUBJECTIVE: no further chest pain. Cath site with mild ecchymosis. No hematoma. Distal pulses intact   Filed Vitals:   02/02/16 1300 02/02/16 1949 02/03/16 0541 02/03/16 0542  BP: 121/80 149/86 137/90 135/74  Pulse: 65 78 70 68  Temp: 98 F (36.7 C) 97.8 F (36.6 C) 98.2 F (36.8 C)   TempSrc: Oral Oral Oral   Resp: 18 18 18    Height:      Weight:      SpO2: 100% 100% 97% 96%    Intake/Output Summary (Last 24 hours) at 02/03/16 0813 Last data filed at 02/03/16 0547  Gross per 24 hour  Intake    760 ml  Output   3425 ml  Net  -2665 ml    LABS: Basic Metabolic Panel:  Recent Labs  21/30/8603/25/17 1133 02/01/16 0537  NA 139 139  K 3.7 3.8  CL 108 107  CO2 27 27  GLUCOSE 98 92  BUN 20 19  CREATININE 0.88 0.98  CALCIUM 8.9 8.7*   Liver Function Tests: No results for input(s): AST, ALT, ALKPHOS, BILITOT, PROT, ALBUMIN in the last 72 hours. No results for input(s): LIPASE, AMYLASE in the last 72 hours. CBC:  Recent Labs  01/31/16 1133 02/01/16 0537  WBC 8.9 9.4  HGB 14.2 13.5  HCT 40.3 38.6*  MCV 88.0 87.2  PLT 204 191   Cardiac Enzymes:  Recent Labs  01/31/16 1735 01/31/16 2321 02/01/16 0537  TROPONINI 0.08* 0.09* 0.08*   BNP: Invalid input(s): POCBNP D-Dimer: No results for input(s): DDIMER in the last 72 hours. Hemoglobin A1C: No results for input(s): HGBA1C in the last 72 hours. Fasting Lipid Panel: No results for input(s): CHOL, HDL, LDLCALC, TRIG, CHOLHDL, LDLDIRECT in the last 72 hours. Thyroid Function Tests: No results for input(s): TSH, T4TOTAL, T3FREE, THYROIDAB in the last 72 hours.  Invalid input(s): FREET3 Anemia Panel: No results for input(s): VITAMINB12, FOLATE, FERRITIN, TIBC, IRON, RETICCTPCT in the last 72 hours.   Physical Exam: Blood pressure 135/74, pulse 68, temperature 98.2 F (36.8 C), temperature source Oral, resp. rate 18, height 5\' 9"  (1.753 m), weight 85.548 kg (188  lb 9.6 oz), SpO2 96 %.   Wt Readings from Last 1 Encounters:  02/02/16 85.548 kg (188 lb 9.6 oz)     General appearance: alert and cooperative Resp: clear to auscultation bilaterally Cardio: regular rate and rhythm GI: soft, non-tender; bowel sounds normal; no masses,  no organomegaly Extremities: extremities normal, atraumatic, no cyanosis or edema Pulses: 2+ and symmetric Neurologic: Grossly normal  TELEMETRY: Reviewed telemetry pt in nsr:  ASSESSMENT AND PLAN:  Active Problems:   Chest pain   NSTEMI (non-ST elevated myocardial infarction) (HCC)-stable s/p very mild nstemi and pci of lad with no further symptoms. OK for discharge on asa 31 mg daily; brilinta 90 mg bid; atorvastatin 40 mg daily; metoprolol tartrate 12.5 mg bid. Cardiac rehab, phase ii.  FOllow up with Dr. Gwen PoundsKowalski in 1 week. May return to work Monday April 3    Harold HedgeFATH,KENNETH A., MD, [facility] 02/03/2016 8:13 AM

## 2016-02-04 NOTE — Discharge Summary (Signed)
[facility] [place] at [facility]   PATIENT NAME: Brian RummageDouglas Waters    MR#:  [id number]  DATE OF BIRTH:  02-Mar-1961  DATE OF ADMISSION:  01/31/2016 ADMITTING PHYSICIAN: Auburn BilberryShreyang Patel, MD  DATE OF DISCHARGE: 02/03/2016 10:22 AM  PRIMARY CARE PHYSICIAN: FELDPAUSCH, DALE E, MD    ADMISSION DIAGNOSIS:  SOB (shortness of breath) [R06.02] Chest pain, unspecified chest pain type [R07.9]  DISCHARGE DIAGNOSIS:  Active Problems:   Chest pain   NSTEMI (non-ST elevated myocardial infarction) (HCC)   SECONDARY DIAGNOSIS:   Past Medical History  Diagnosis Date  . Seasonal allergies     HOSPITAL COURSE:   55 year old male with no significant past medical history, who presents to the hospital with ongoing chest pain, shortness of breath.  #1 NSTEMI-Patient presented to the hospital with worsening chest pain and mildly elevated troponin. He has had a negative stress test recently prior to this hospitalization. Given his ongoing symptoms he was admitted to the hospital and a Cardiology consult was obtained who recommended cardiac catheterization. -Patient underwent a cardiac catheterization which showed significant single-vessel disease at the proximal LAD. Patient underwent a drug-eluting stent placement and postprocedure was chest pain-free and feeling much better. -He is presently being discharged on aspirin, Brilinta, metoprolol, statin and follow-up with cardiology as an outpatient.  #2 seasonal allergies-no acute issue while in the hospital    DISCHARGE CONDITIONS:   Stable  CONSULTS OBTAINED:  Treatment Team:  Auburn BilberryShreyang Patel, MD Dalia HeadingKenneth A Fath, MD  DRUG ALLERGIES:   Allergies  Allergen Reactions  . Ibuprofen Diarrhea  . Codeine Rash  . Penicillins Rash    Has patient had a PCN reaction causing immediate rash, facial/tongue/throat swelling, SOB or lightheadedness with hypotension: Yes Has patient had a PCN reaction causing severe rash involving  mucus membranes or skin necrosis: No Has patient had a PCN reaction that required hospitalization No Has patient had a PCN reaction occurring within the last 10 years: No If all of the above answers are "NO", then may proceed with Cephalosporin use.     DISCHARGE MEDICATIONS:   Discharge Medication List as of 02/03/2016  9:36 AM    START taking these medications   Details  aspirin EC 81 MG tablet Take 1 tablet (81 mg total) by mouth daily., Starting 02/03/2016, Until Discontinued, No Print    atorvastatin (LIPITOR) 40 MG tablet Take 1 tablet (40 mg total) by mouth daily at 6 PM., Starting 02/03/2016, Until Discontinued, Print    metoprolol tartrate (LOPRESSOR) 25 MG tablet Take 0.5 tablets (12.5 mg total) by mouth 2 (two) times daily., Starting 02/03/2016, Until Discontinued, Print    ticagrelor (BRILINTA) 90 MG TABS tablet Take 1 tablet (90 mg total) by mouth 2 (two) times daily., Starting 02/03/2016, Until Discontinued, Print      CONTINUE these medications which have NOT CHANGED   Details  fluticasone (FLONASE) 50 MCG/ACT nasal spray Place 2 sprays into both nostrils daily. , Until Discontinued, Historical Med    loratadine (CLARITIN) 10 MG tablet Take 10 mg by mouth daily., Until Discontinued, Historical Med      STOP taking these medications     aspirin 81 MG chewable tablet      naproxen (NAPROSYN) 375 MG tablet          DISCHARGE INSTRUCTIONS:   DIET:  Cardiac diet  DISCHARGE CONDITION:  Stable  ACTIVITY:  Activity as tolerated  OXYGEN:  Home Oxygen: No.   Oxygen Delivery: room air  DISCHARGE  LOCATION:  home   If you experience worsening of your admission symptoms, develop shortness of breath, life threatening emergency, suicidal or homicidal thoughts you must seek medical attention immediately by calling 911 or calling your MD immediately  if symptoms less severe.  You Must read complete instructions/literature along with all the possible adverse  reactions/side effects for all the Medicines you take and that have been prescribed to you. Take any new Medicines after you have completely understood and accpet all the possible adverse reactions/side effects.   Please note  You were cared for by a hospitalist during your hospital stay. If you have any questions about your discharge medications or the care you received while you were in the hospital after you are discharged, you can call the unit and asked to speak with the hospitalist on call if the hospitalist that took care of you is not available. Once you are discharged, your primary care physician will handle any further medical issues. Please note that NO REFILLS for any discharge medications will be authorized once you are discharged, as it is imperative that you return to your primary care physician (or establish a relationship with a primary care physician if you do not have one) for your aftercare needs so that they can reassess your need for medications and monitor your lab values.     Today   Denies any chest pain, shortness of breath. Feels much better.  VITAL SIGNS:  Blood pressure 142/83, pulse 66, temperature 98.6 F (37 C), temperature source Oral, resp. rate 16, height  (1.753 m), weight 85.548 kg (188 lb 9.6 oz), SpO2 98 %.  I/O:  No intake or output data in the 24 hours ending 02/04/16 1517  PHYSICAL EXAMINATION:  GENERAL:  55 y.o.-year-old patient lying in the bed with no acute distress.  EYES: Pupils equal, round, reactive to light and accommodation. No scleral icterus. Extraocular muscles intact.  HEENT: Head atraumatic, normocephalic. Oropharynx and nasopharynx clear.  NECK:  Supple, no jugular venous distention. No thyroid enlargement, no tenderness.  LUNGS: Normal breath sounds bilaterally, no wheezing, rales,rhonchi. No use of accessory muscles of respiration.  CARDIOVASCULAR: S1, S2 normal. No murmurs, rubs, or gallops.  ABDOMEN: Soft, non-tender,  non-distended. Bowel sounds present. No organomegaly or mass.  EXTREMITIES: No pedal edema, cyanosis, or clubbing.  NEUROLOGIC: Cranial nerves II through XII are intact. No focal motor or sensory defecits b/l.  PSYCHIATRIC: The patient is alert and oriented x 3. Good affect.  SKIN: No obvious rash, lesion, or ulcer.   DATA REVIEW:   CBC  Recent Labs Lab 02/01/16 0537  WBC 9.4  HGB 13.5  HCT 38.6*  PLT 191    Chemistries   Recent Labs Lab 02/01/16 0537  NA 139  K 3.8  CL 107  CO2 27  GLUCOSE 92  BUN 19  CREATININE 0.98  CALCIUM 8.7*    Cardiac Enzymes  Recent Labs Lab 02/01/16 0537  TROPONINI 0.08*    RADIOLOGY:  No results found.    Management plans discussed with the patient, family and they are in agreement.  CODE STATUS:  Code Status History    Date Active Date Inactive Code Status Order ID Comments User Context   02/02/2016 11:44 AM 02/03/2016  1:22 PM Full Code [id number]  Alwyn Pea, MD Inpatient   01/31/2016  3:17 PM 02/02/2016 11:44 AM Full Code [id number]  Auburn Bilberry, MD Inpatient      TOTAL TIME TAKING CARE OF THIS PATIENT: 40 minutes.  Houston Siren M.D on 02/04/2016 at 3:17 PM  Between 7am to 6pm - Pager - [phone]  After 6pm go to [email or web address] - password [email or web address] [facility]  [facility]  Office  [phone]  CC: Primary care physician; [facility], Madaline Guthrie, MD

## 2016-02-18 DIAGNOSIS — E782 Mixed hyperlipidemia: Secondary | ICD-10-CM | POA: Insufficient documentation

## 2016-02-18 DIAGNOSIS — I2510 Atherosclerotic heart disease of native coronary artery without angina pectoris: Secondary | ICD-10-CM | POA: Insufficient documentation

## 2016-03-01 ENCOUNTER — Encounter: Payer: Federal, State, Local not specified - PPO | Attending: Cardiovascular Disease | Admitting: *Deleted

## 2016-03-01 VITALS — Ht 70.0 in | Wt 195.1 lb

## 2016-03-01 DIAGNOSIS — Z9861 Coronary angioplasty status: Secondary | ICD-10-CM | POA: Diagnosis present

## 2016-03-01 DIAGNOSIS — I214 Non-ST elevation (NSTEMI) myocardial infarction: Secondary | ICD-10-CM | POA: Diagnosis present

## 2016-03-01 NOTE — Progress Notes (Signed)
Cardiac Individual Treatment Plan  Patient Details  Name: Brian Waters MRN: [id number] Date of Birth: [birth year] Referring Provider:    Initial Encounter Date:       Cardiac Rehab from 03/01/2016 in [facility] Cardiac and Pulmonary Rehab   Date  03/01/16      Visit Diagnosis: NSTEMI (non-ST elevated myocardial infarction) (HCC)  S/P PTCA (percutaneous transluminal coronary angioplasty)  Patient's Home Medications on Admission:  Current outpatient prescriptions:  .  aspirin EC 81 MG tablet, Take 1 tablet (81 mg total) by mouth daily., Disp: 60 tablet, Rfl:  .  atorvastatin (LIPITOR) 40 MG tablet, Take 1 tablet (40 mg total) by mouth daily at 6 PM., Disp: 60 tablet, Rfl: 1 .  fluticasone (FLONASE) 50 MCG/ACT nasal spray, Place 2 sprays into both nostrils daily. , Disp: , Rfl:  .  loratadine (CLARITIN) 10 MG tablet, Take 10 mg by mouth daily., Disp: , Rfl:  .  Multiple Vitamin (MULTIVITAMIN) tablet, Take 1 tablet by mouth daily., Disp: , Rfl:  .  naproxen sodium (ANAPROX) 220 MG tablet, Take 220 mg by mouth 2 (two) times daily., Disp: , Rfl:  .  ticagrelor (BRILINTA) 90 MG TABS tablet, Take 1 tablet (90 mg total) by mouth 2 (two) times daily., Disp: 60 tablet, Rfl: 1 .  metoprolol tartrate (LOPRESSOR) 25 MG tablet, Take 0.5 tablets (12.5 mg total) by mouth 2 (two) times daily. (Patient not taking: Reported on 03/01/2016), Disp: 60 tablet, Rfl: 1  Past Medical History: Past Medical History  Diagnosis Date  . Seasonal allergies     Tobacco Use: History  Smoking status  . Never Smoker   Smokeless tobacco  . Not on file    Labs: Recent Review Flowsheet Data    There is no flowsheet data to display.       Exercise Target Goals: Date: 03/01/16  Exercise Program Goal: Individual exercise prescription set with THRR, safety & activity barriers. Participant demonstrates ability to understand and report RPE using BORG scale, to self-measure pulse accurately, and to acknowledge the  importance of the exercise prescription.  Exercise Prescription Goal: Starting with aerobic activity 30 plus minutes a day, 3 days per week for initial exercise prescription. Provide home exercise prescription and guidelines that participant acknowledges understanding prior to discharge.  Activity Barriers & Risk Stratification:     Activity Barriers & Cardiac Risk Stratification - 03/01/16 1441    Activity Barriers & Cardiac Risk Stratification   Cardiac Risk Stratification Moderate      6 Minute Walk:     6 Minute Walk      03/01/16 1523       6 Minute Walk   Phase Initial     Distance 1970 feet     Walk Time 6 minutes     MPH 3.7     RPE 13     Resting HR 69 bpm     Resting BP 126/64 mmHg     Max Ex. HR 112 bpm     Max Ex. BP 140/76 mmHg        Initial Exercise Prescription:     Initial Exercise Prescription - 03/01/16 1500    Date of Initial Exercise RX and Referring Provider   Date 03/01/16   Treadmill   MPH 3   Grade 0   Minutes 15   Bike   Level 1   Minutes 15   Recumbant Bike   Level 3   Watts 30   Minutes 15  NuStep   Level 3   Watts 50   Minutes 15   Cybex   Level 2   RPM 50   Minutes 15   Recumbant Elliptical   Level 2   Watts 20   Minutes 15   Elliptical   Level 1   Speed 3   Minutes 15   REL-XR   Level 2   Watts 60   Minutes 15   T5 Nustep   Level 2   Watts 50   Minutes 15   Biostep-RELP   Level 4   Watts 50   Minutes 15   Prescription Details   Frequency (times per week) 3   Duration Progress to 45 minutes of aerobic exercise without signs/symptoms of physical distress   Intensity   THRR REST +  30   Ratings of Perceived Exertion 11-13   Perceived Dyspnea 0-4   Progression   Progression Continue to progress workloads to maintain intensity without signs/symptoms of physical distress.   Resistance Training   Training Prescription Yes   Weight 3   Reps 10-15  Pt recently had left shoulder surgery.         Perform Capillary Blood Glucose checks as needed.  Exercise Prescription Changes:     Exercise Prescription Changes      03/01/16 1500           Response to Exercise   Blood Pressure (Admit) 126/64 mmHg       Blood Pressure (Exercise) 140/76 mmHg       Blood Pressure (Exit) 126/84 mmHg       Heart Rate (Admit) 76 bpm       Heart Rate (Exercise) 113 bpm       Heart Rate (Exit) 75 bpm          Exercise Comments:   Discharge Exercise Prescription (Final Exercise Prescription Changes):     Exercise Prescription Changes - 03/01/16 1500    Response to Exercise   Blood Pressure (Admit) 126/64 mmHg   Blood Pressure (Exercise) 140/76 mmHg   Blood Pressure (Exit) 126/84 mmHg   Heart Rate (Admit) 76 bpm   Heart Rate (Exercise) 113 bpm   Heart Rate (Exit) 75 bpm      Nutrition:  Target Goals: Understanding of nutrition guidelines, daily intake of sodium 1500mg , cholesterol 200mg , calories 30% from fat and 7% or less from saturated fats, daily to have 5 or more servings of fruits and vegetables.  Biometrics:     Pre Biometrics - 03/01/16 1524    Pre Biometrics   Height  (1.778 m)   Weight 195 lb 1.6 oz (88.497 kg)   Waist Circumference 40.25 inches   Hip Circumference 43.5 inches   Waist to Hip Ratio 0.93 %   BMI (Calculated) 28.1       Nutrition Therapy Plan and Nutrition Goals:     Nutrition Therapy & Goals - 03/01/16 1359    Intervention Plan   Intervention Prescribe, educate and counsel regarding individualized specific dietary modifications aiming towards targeted core components such as weight, hypertension, lipid management, diabetes, heart failure and other comorbidities.   Expected Outcomes Short Term Goal: Understand basic principles of dietary content, such as calories, fat, sodium, cholesterol and nutrients.;Short Term Goal: A plan has been developed with personal nutrition goals set during dietitian appointment.;Long Term Goal: Adherence to  prescribed nutrition plan.      Nutrition Discharge: Rate Your Plate Scores:   Nutrition Goals Re-Evaluation:   Psychosocial:  Target Goals: Acknowledge presence or absence of depression, maximize coping skills, provide positive support system. Participant is able to verbalize types and ability to use techniques and skills needed for reducing stress and depression.  Initial Review & Psychosocial Screening:     Initial Psych Review & Screening - 03/01/16 1437    Initial Review   Current issues with Current Stress Concerns  Stress issiues with work and family. Overwhelmed with current medical diagnosis.  Has stated  he took time off this week and that time away form work has helped reduce the stress feelings.    Source of Stress Concerns Occupation   Comments One child in college and one in high school.   Another graduated from college.    Family Dynamics   Good Support System? Yes  Wife and children   Barriers   Psychosocial barriers to participate in program There are no identifiable barriers or psychosocial needs.;The patient should benefit from training in stress management and relaxation.   Screening Interventions   Interventions Encouraged to exercise      Quality of Life Scores:     Quality of Life - 03/01/16 1540    Quality of Life Scores   Health/Function Pre 19.57 %   Socioeconomic Pre 23.42 %   Psych/Spiritual Pre 21.07 %   Family Pre 27.1 %   GLOBAL Pre 21.73 %      PHQ-9:     Recent Review Flowsheet Data    [facility]   Decreased Interest 1   Down, Depressed, Hopeless 1   PHQ - 2 Score 2   Altered sleeping 1   Tired, decreased energy 1   Change in appetite 1   Feeling bad or failure about yourself  1   Trouble concentrating 1   Moving slowly or fidgety/restless 1   Suicidal thoughts 0   PHQ-9 Score 8   Difficult doing work/chores Somewhat difficult       Psychosocial Evaluation and Intervention:   Psychosocial  Re-Evaluation:   Vocational Rehabilitation: Provide vocational rehab assistance to qualifying candidates.   Vocational Rehab Evaluation & Intervention:     Vocational Rehab - 03/01/16 1401    Initial Vocational Rehab Evaluation & Intervention   Assessment shows need for Vocational Rehabilitation No      Education: Education Goals: Education classes will be provided on a weekly basis, covering required topics. Participant will state understanding/return demonstration of topics presented.  Learning Barriers/Preferences:     Learning Barriers/Preferences - 03/01/16 1441    Learning Barriers/Preferences   Learning Barriers None   Learning Preferences Computer/Internet;Individual Instruction;Video      Education Topics: General Nutrition Guidelines/Fats and Fiber: -Group instruction provided by verbal, written material, models and posters to present the general guidelines for heart healthy nutrition. Gives an explanation and review of dietary fats and fiber.   Controlling Sodium/Reading Food Labels: -Group verbal and written material supporting the discussion of sodium use in heart healthy nutrition. Review and explanation with models, verbal and written materials for utilization of the food label.   Exercise Physiology & Risk Factors: - Group verbal and written instruction with models to review the exercise physiology of the cardiovascular system and associated critical values. Details cardiovascular disease risk factors and the goals associated with each risk factor.   Aerobic Exercise & Resistance Training: - Gives group verbal and written discussion on the health impact of inactivity. On the components of aerobic and resistive training programs and the benefits of this training  and how to safely progress through these programs.   Flexibility, Balance, General Exercise Guidelines: - Provides group verbal and written instruction on the benefits of flexibility and balance  training programs. Provides general exercise guidelines with specific guidelines to those with heart or lung disease. Demonstration and skill practice provided.   Stress Management: - Provides group verbal and written instruction about the health risks of elevated stress, cause of high stress, and healthy ways to reduce stress.   Depression: - Provides group verbal and written instruction on the correlation between heart/lung disease and depressed mood, treatment options, and the stigmas associated with seeking treatment.   Anatomy & Physiology of the Heart: - Group verbal and written instruction and models provide basic cardiac anatomy and physiology, with the coronary electrical and arterial systems. Review of: AMI, Angina, Valve disease, Heart Failure, Cardiac Arrhythmia, Pacemakers, and the ICD.   Cardiac Procedures: - Group verbal and written instruction and models to describe the testing methods done to diagnose heart disease. Reviews the outcomes of the test results. Describes the treatment choices: Medical Management, Angioplasty, or Coronary Bypass Surgery.   Cardiac Medications: - Group verbal and written instruction to review commonly prescribed medications for heart disease. Reviews the medication, class of the drug, and side effects. Includes the steps to properly store meds and maintain the prescription regimen.   Go Sex-Intimacy & Heart Disease, Get SMART - Goal Setting: - Group verbal and written instruction through game format to discuss heart disease and the return to sexual intimacy. Provides group verbal and written material to discuss and apply goal setting through the application of the S.M.A.R.T. Method.   Other Matters of the Heart: - Provides group verbal, written materials and models to describe Heart Failure, Angina, Valve Disease, and Diabetes in the realm of heart disease. Includes description of the disease process and treatment options available to the  cardiac patient.   Exercise & Equipment Safety: - Individual verbal instruction and demonstration of equipment use and safety with use of the equipment.          [facility]   Date  03/01/16   Educator  SB   Instruction Review Code  2- meets goals/outcomes      Infection Prevention: - Provides verbal and written material to individual with discussion of infection control including proper hand washing and proper equipment cleaning during exercise session.      [facility]   Date  03/01/16   Educator  SB   Instruction Review Code  2- meets goals/outcomes      Falls Prevention: - Provides verbal and written material to individual with discussion of falls prevention and safety.      [facility]   Date  03/01/16   Educator  SB   Instruction Review Code  2- meets goals/outcomes      Diabetes: - Individual verbal and written instruction to review signs/symptoms of diabetes, desired ranges of glucose level fasting, after meals and with exercise. Advice that pre and post exercise glucose checks will be done for 3 sessions at entry of program.    Knowledge Questionnaire Score:     Knowledge Questionnaire Score - 03/01/16 1447    Knowledge Questionnaire Score   Pre Score 22/28      Core Components/Risk Factors/Patient Goals at Admission:     Personal Goals and Risk Factors at  Admission - 03/01/16 1435    Core Components/Risk Factors/Patient Goals on Admission   Sedentary Yes   Intervention Provide advice, education, support and counseling about physical activity/exercise needs.;Develop an individualized exercise prescription for aerobic and resistive training based on initial evaluation findings, risk stratification, comorbidities and participant's personal goals.   Expected Outcomes Achievement of increased  cardiorespiratory fitness and enhanced flexibility, muscular endurance and strength shown through measurements of functional capacity and personal statement of participant.   Stress Yes  Stress more work related.  [name] has talked to his boss about making changes to reduce the work stress.   Intervention Offer individual and/or small group education and counseling on adjustment to heart disease, stress management and health-related lifestyle change. Teach and support self-help strategies.;Refer participants experiencing significant psychosocial distress to appropriate mental health specialists for further evaluation and treatment. When possible, include family members and significant others in education/counseling sessions.   Expected Outcomes Short Term: Participant demonstrates changes in health-related behavior, relaxation and other stress management skills, ability to obtain effective social support, and compliance with psychotropic medications if prescribed.;Long Term: Emotional wellbeing is indicated by absence of clinically significant psychosocial distress or social isolation.      Core Components/Risk Factors/Patient Goals Review:    Core Components/Risk Factors/Patient Goals at Discharge (Final Review):    ITP Comments:     ITP Comments      03/01/16 1359           ITP Comments inital med review completed.  ITP-continue with ITP          Comments:

## 2016-03-01 NOTE — Patient Instructions (Signed)
Patient Instructions  Patient Details  Name: Brian Waters MRN: [id number] Date of Birth: 1961-05-29 Referring Provider:  Iran Ouch, MD  Below are the personal goals you chose as well as exercise and nutrition goals. Our goal is to help you keep on track towards obtaining and maintaining your goals. We will be discussing your progress on these goals with you throughout the program.  Initial Exercise Prescription:     Initial Exercise Prescription - 03/01/16 1500    Date of Initial Exercise RX and Referring Provider   Date 03/01/16   Treadmill   MPH 3   Grade 0   Minutes 15   Bike   Level 1   Minutes 15   Recumbant Bike   Level 3   Watts 30   Minutes 15   NuStep   Level 3   Watts 50   Minutes 15   Cybex   Level 2   RPM 50   Minutes 15   Recumbant Elliptical   Level 2   Watts 20   Minutes 15   Elliptical   Level 1   Speed 3   Minutes 15   REL-XR   Level 2   Watts 60   Minutes 15   T5 Nustep   Level 2   Watts 50   Minutes 15   Biostep-RELP   Level 4   Watts 50   Minutes 15   Prescription Details   Frequency (times per week) 3   Duration Progress to 45 minutes of aerobic exercise without signs/symptoms of physical distress   Intensity   THRR REST +  30   Ratings of Perceived Exertion 11-13   Perceived Dyspnea 0-4   Progression   Progression Continue to progress workloads to maintain intensity without signs/symptoms of physical distress.   Resistance Training   Training Prescription Yes   Weight 3   Reps 10-15  Pt recently had left shoulder surgery.        Exercise Goals: Frequency: Be able to perform aerobic exercise three times per week working toward 3-5 days per week.  Intensity: Work with a perceived exertion of 11 (fairly light) - 15 (hard) as tolerated. Follow your new exercise prescription and watch for changes in prescription as you progress with the program. Changes will be reviewed with you when they are made.  Duration: You  should be able to do 30 minutes of continuous aerobic exercise in addition to a 5 minute warm-up and a 5 minute cool-down routine.  Nutrition Goals: Your personal nutrition goals will be established when you do your nutrition analysis with the dietician.  The following are nutrition guidelines to follow: Cholesterol < /day Sodium < /day Fiber: Men over 50 yrs - 30 grams per day  Personal Goals:     Personal Goals and Risk Factors at Admission - 03/01/16 1435    Core Components/Risk Factors/Patient Goals on Admission   Sedentary Yes   Intervention Provide advice, education, support and counseling about physical activity/exercise needs.;Develop an individualized exercise prescription for aerobic and resistive training based on initial evaluation findings, risk stratification, comorbidities and participant's personal goals.   Expected Outcomes Achievement of increased cardiorespiratory fitness and enhanced flexibility, muscular endurance and strength shown through measurements of functional capacity and personal statement of participant.   Stress Yes  Stress more work related.  Brian Waters has talked to his boss about making changes to reduce the work stress.   Intervention Offer individual and/or small group education and  counseling on adjustment to heart disease, stress management and health-related lifestyle change. Teach and support self-help strategies.;Refer participants experiencing significant psychosocial distress to appropriate mental health specialists for further evaluation and treatment. When possible, include family members and significant others in education/counseling sessions.   Expected Outcomes Short Term: Participant demonstrates changes in health-related behavior, relaxation and other stress management skills, ability to obtain effective social support, and compliance with psychotropic medications if prescribed.;Long Term: Emotional wellbeing is indicated by absence of  clinically significant psychosocial distress or social isolation.      Tobacco Use Initial Evaluation: History  Smoking status  . Never Smoker   Smokeless tobacco  . Not on file    Copy of goals given to participant.

## 2016-03-03 ENCOUNTER — Encounter: Payer: Federal, State, Local not specified - PPO | Admitting: *Deleted

## 2016-03-03 ENCOUNTER — Encounter: Payer: Self-pay | Admitting: *Deleted

## 2016-03-03 DIAGNOSIS — I214 Non-ST elevation (NSTEMI) myocardial infarction: Secondary | ICD-10-CM | POA: Diagnosis not present

## 2016-03-03 DIAGNOSIS — Z9861 Coronary angioplasty status: Secondary | ICD-10-CM

## 2016-03-03 NOTE — Progress Notes (Signed)
Cardiac Individual Treatment Plan  Patient Details  Name: Brian Waters MRN: [id number] Date of Birth: 19-Sep-1961 Referring Provider:    Initial Encounter Date:       Cardiac Rehab from 03/01/2016 in [facility] Cardiac and Pulmonary Rehab   Date  03/01/16      Visit Diagnosis: NSTEMI (non-ST elevated myocardial infarction) (HCC)  S/P PTCA (percutaneous transluminal coronary angioplasty)  Patient's Home Medications on Admission:  Current outpatient prescriptions:  .  aspirin EC 81 MG tablet, Take 1 tablet (81 mg total) by mouth daily., Disp: 60 tablet, Rfl:  .  atorvastatin (LIPITOR) 40 MG tablet, Take 1 tablet (40 mg total) by mouth daily at 6 PM., Disp: 60 tablet, Rfl: 1 .  fluticasone (FLONASE) 50 MCG/ACT nasal spray, Place 2 sprays into both nostrils daily. , Disp: , Rfl:  .  loratadine (CLARITIN) 10 MG tablet, Take 10 mg by mouth daily., Disp: , Rfl:  .  metoprolol tartrate (LOPRESSOR) 25 MG tablet, Take 0.5 tablets (12.5 mg total) by mouth 2 (two) times daily. (Patient not taking: Reported on 03/01/2016), Disp: 60 tablet, Rfl: 1 .  Multiple Vitamin (MULTIVITAMIN) tablet, Take 1 tablet by mouth daily., Disp: , Rfl:  .  naproxen sodium (ANAPROX) 220 MG tablet, Take 220 mg by mouth 2 (two) times daily., Disp: , Rfl:  .  ticagrelor (BRILINTA) 90 MG TABS tablet, Take 1 tablet (90 mg total) by mouth 2 (two) times daily., Disp: 60 tablet, Rfl: 1  Past Medical History: Past Medical History  Diagnosis Date  . Seasonal allergies     Tobacco Use: History  Smoking status  . Never Smoker   Smokeless tobacco  . Not on file    Labs: Recent Review Flowsheet Data    There is no flowsheet data to display.       Exercise Target Goals:    Exercise Program Goal: Individual exercise prescription set with THRR, safety & activity barriers. Participant demonstrates ability to understand and report RPE using BORG scale, to self-measure pulse accurately, and to acknowledge the importance  of the exercise prescription.  Exercise Prescription Goal: Starting with aerobic activity 30 plus minutes a day, 3 days per week for initial exercise prescription. Provide home exercise prescription and guidelines that participant acknowledges understanding prior to discharge.  Activity Barriers & Risk Stratification:     Activity Barriers & Cardiac Risk Stratification - 03/01/16 1441    Activity Barriers & Cardiac Risk Stratification   Cardiac Risk Stratification Moderate      6 Minute Walk:     6 Minute Walk      03/01/16 1523       6 Minute Walk   Phase Initial     Distance 1970 feet     Walk Time 6 minutes     MPH 3.7     RPE 13     Resting HR 69 bpm     Resting BP 126/64 mmHg     Max Ex. HR 112 bpm     Max Ex. BP 140/76 mmHg        Initial Exercise Prescription:     Initial Exercise Prescription - 03/01/16 1500    Date of Initial Exercise RX and Referring Provider   Date 03/01/16   Treadmill   MPH 3   Grade 0   Minutes 15   Bike   Level 1   Minutes 15   Recumbant Bike   Level 3   Watts 30   Minutes 15  NuStep   Level 3   Watts 50   Minutes 15   Cybex   Level 2   RPM 50   Minutes 15   Recumbant Elliptical   Level 2   Watts 20   Minutes 15   Elliptical   Level 1   Speed 3   Minutes 15   REL-XR   Level 2   Watts 60   Minutes 15   T5 Nustep   Level 2   Watts 50   Minutes 15   Biostep-RELP   Level 4   Watts 50   Minutes 15   Prescription Details   Frequency (times per week) 3   Duration Progress to 45 minutes of aerobic exercise without signs/symptoms of physical distress   Intensity   THRR REST +  30   Ratings of Perceived Exertion 11-13   Perceived Dyspnea 0-4   Progression   Progression Continue to progress workloads to maintain intensity without signs/symptoms of physical distress.   Resistance Training   Training Prescription Yes   Weight 3   Reps 10-15  Pt recently had left shoulder surgery.        Perform  Capillary Blood Glucose checks as needed.  Exercise Prescription Changes:     Exercise Prescription Changes      03/01/16 1500           Response to Exercise   Blood Pressure (Admit) 126/64 mmHg       Blood Pressure (Exercise) 140/76 mmHg       Blood Pressure (Exit) 126/84 mmHg       Heart Rate (Admit) 76 bpm       Heart Rate (Exercise) 113 bpm       Heart Rate (Exit) 75 bpm          Exercise Comments:   Discharge Exercise Prescription (Final Exercise Prescription Changes):     Exercise Prescription Changes - 03/01/16 1500    Response to Exercise   Blood Pressure (Admit) 126/64 mmHg   Blood Pressure (Exercise) 140/76 mmHg   Blood Pressure (Exit) 126/84 mmHg   Heart Rate (Admit) 76 bpm   Heart Rate (Exercise) 113 bpm   Heart Rate (Exit) 75 bpm      Nutrition:  Target Goals: Understanding of nutrition guidelines, daily intake of sodium 1500mg , cholesterol 200mg , calories 30% from fat and 7% or less from saturated fats, daily to have 5 or more servings of fruits and vegetables.  Biometrics:     Pre Biometrics - 03/01/16 1524    Pre Biometrics   Height  (1.778 m)   Weight 195 lb 1.6 oz (88.497 kg)   Waist Circumference 40.25 inches   Hip Circumference 43.5 inches   Waist to Hip Ratio 0.93 %   BMI (Calculated) 28.1       Nutrition Therapy Plan and Nutrition Goals:     Nutrition Therapy & Goals - 03/01/16 1359    Intervention Plan   Intervention Prescribe, educate and counsel regarding individualized specific dietary modifications aiming towards targeted core components such as weight, hypertension, lipid management, diabetes, heart failure and other comorbidities.   Expected Outcomes Short Term Goal: Understand basic principles of dietary content, such as calories, fat, sodium, cholesterol and nutrients.;Short Term Goal: A plan has been developed with personal nutrition goals set during dietitian appointment.;Long Term Goal: Adherence to prescribed  nutrition plan.      Nutrition Discharge: Rate Your Plate Scores:   Nutrition Goals Re-Evaluation:   Psychosocial:  Target Goals: Acknowledge presence or absence of depression, maximize coping skills, provide positive support system. Participant is able to verbalize types and ability to use techniques and skills needed for reducing stress and depression.  Initial Review & Psychosocial Screening:     Initial Psych Review & Screening - 03/01/16 1437    Initial Review   Current issues with Current Stress Concerns  Stress issiues with work and family. Overwhelmed with current medical diagnosis.  Has stated  he took time off this week and that time away form work has helped reduce the stress feelings.    Source of Stress Concerns Occupation   Comments One child in college and one in high school.   Another graduated from college.    Family Dynamics   Good Support System? Yes  Wife and children   Barriers   Psychosocial barriers to participate in program There are no identifiable barriers or psychosocial needs.;The patient should benefit from training in stress management and relaxation.   Screening Interventions   Interventions Encouraged to exercise      Quality of Life Scores:     Quality of Life - 03/01/16 1540    Quality of Life Scores   Health/Function Pre 19.57 %   Socioeconomic Pre 23.42 %   Psych/Spiritual Pre 21.07 %   Family Pre 27.1 %   GLOBAL Pre 21.73 %      PHQ-9:     Recent Review Flowsheet Data    [facility]/01[facility]   Decreased Interest 1   Down, Depressed, Hopeless 1   PHQ - 2 Score 2   Altered sleeping 1   Tired, decreased energy 1   Change in appetite 1   Feeling bad or failure about yourself  1   Trouble concentrating 1   Moving slowly or fidgety/restless 1   Suicidal thoughts 0   PHQ-9 Score 8   Difficult doing work/chores Somewhat difficult       Psychosocial Evaluation and Intervention:   Psychosocial  Re-Evaluation:   Vocational Rehabilitation: Provide vocational rehab assistance to qualifying candidates.   Vocational Rehab Evaluation & Intervention:     Vocational Rehab - 03/01/16 1401    Initial Vocational Rehab Evaluation & Intervention   Assessment shows need for Vocational Rehabilitation No      Education: Education Goals: Education classes will be provided on a weekly basis, covering required topics. Participant will state understanding/return demonstration of topics presented.  Learning Barriers/Preferences:     Learning Barriers/Preferences - 03/01/16 1441    Learning Barriers/Preferences   Learning Barriers None   Learning Preferences Computer/Internet;Individual Instruction;Video      Education Topics: General Nutrition Guidelines/Fats and Fiber: -Group instruction provided by verbal, written material, models and posters to present the general guidelines for heart healthy nutrition. Gives an explanation and review of dietary fats and fiber.   Controlling Sodium/Reading Food Labels: -Group verbal and written material supporting the discussion of sodium use in heart healthy nutrition. Review and explanation with models, verbal and written materials for utilization of the food label.   Exercise Physiology & Risk Factors: - Group verbal and written instruction with models to review the exercise physiology of the cardiovascular system and associated critical values. Details cardiovascular disease risk factors and the goals associated with each risk factor.   Aerobic Exercise & Resistance Training: - Gives group verbal and written discussion on the health impact of inactivity. On the components of aerobic and resistive training programs and the benefits of this training  and how to safely progress through these programs.   Flexibility, Balance, General Exercise Guidelines: - Provides group verbal and written instruction on the benefits of flexibility and balance  training programs. Provides general exercise guidelines with specific guidelines to those with heart or lung disease. Demonstration and skill practice provided.   Stress Management: - Provides group verbal and written instruction about the health risks of elevated stress, cause of high stress, and healthy ways to reduce stress.   Depression: - Provides group verbal and written instruction on the correlation between heart/lung disease and depressed mood, treatment options, and the stigmas associated with seeking treatment.   Anatomy & Physiology of the Heart: - Group verbal and written instruction and models provide basic cardiac anatomy and physiology, with the coronary electrical and arterial systems. Review of: AMI, Angina, Valve disease, Heart Failure, Cardiac Arrhythmia, Pacemakers, and the ICD.   Cardiac Procedures: - Group verbal and written instruction and models to describe the testing methods done to diagnose heart disease. Reviews the outcomes of the test results. Describes the treatment choices: Medical Management, Angioplasty, or Coronary Bypass Surgery.   Cardiac Medications: - Group verbal and written instruction to review commonly prescribed medications for heart disease. Reviews the medication, class of the drug, and side effects. Includes the steps to properly store meds and maintain the prescription regimen.   Go Sex-Intimacy & Heart Disease, Get SMART - Goal Setting: - Group verbal and written instruction through game format to discuss heart disease and the return to sexual intimacy. Provides group verbal and written material to discuss and apply goal setting through the application of the S.M.A.R.T. Method.   Other Matters of the Heart: - Provides group verbal, written materials and models to describe Heart Failure, Angina, Valve Disease, and Diabetes in the realm of heart disease. Includes description of the disease process and treatment options available to the  cardiac patient.   Exercise & Equipment Safety: - Individual verbal instruction and demonstration of equipment use and safety with use of the equipment.          [facility]   Date  03/01/16   Educator  SB   Instruction Review Code  2- meets goals/outcomes      Infection Prevention: - Provides verbal and written material to individual with discussion of infection control including proper hand washing and proper equipment cleaning during exercise session.      [facility]   Date  03/01/16   Educator  SB   Instruction Review Code  2- meets goals/outcomes      Falls Prevention: - Provides verbal and written material to individual with discussion of falls prevention and safety.      [facility]   Date  03/01/16   Educator  SB   Instruction Review Code  2- meets goals/outcomes      Diabetes: - Individual verbal and written instruction to review signs/symptoms of diabetes, desired ranges of glucose level fasting, after meals and with exercise. Advice that pre and post exercise glucose checks will be done for 3 sessions at entry of program.    Knowledge Questionnaire Score:     Knowledge Questionnaire Score - 03/01/16 1447    Knowledge Questionnaire Score   Pre Score 22/28      Core Components/Risk Factors/Patient Goals at Admission:     Personal Goals and Risk Factors at  Admission - 03/01/16 1435    Core Components/Risk Factors/Patient Goals on Admission   Sedentary Yes   Intervention Provide advice, education, support and counseling about physical activity/exercise needs.;Develop an individualized exercise prescription for aerobic and resistive training based on initial evaluation findings, risk stratification, comorbidities and participant's personal goals.   Expected Outcomes Achievement of increased  cardiorespiratory fitness and enhanced flexibility, muscular endurance and strength shown through measurements of functional capacity and personal statement of participant.   Stress Yes  Stress more work related.  [name] has talked to his boss about making changes to reduce the work stress.   Intervention Offer individual and/or small group education and counseling on adjustment to heart disease, stress management and health-related lifestyle change. Teach and support self-help strategies.;Refer participants experiencing significant psychosocial distress to appropriate mental health specialists for further evaluation and treatment. When possible, include family members and significant others in education/counseling sessions.   Expected Outcomes Short Term: Participant demonstrates changes in health-related behavior, relaxation and other stress management skills, ability to obtain effective social support, and compliance with psychotropic medications if prescribed.;Long Term: Emotional wellbeing is indicated by absence of clinically significant psychosocial distress or social isolation.      Core Components/Risk Factors/Patient Goals Review:    Core Components/Risk Factors/Patient Goals at Discharge (Final Review):    ITP Comments:     ITP Comments      03/01/16 1359 03/03/16 0657         ITP Comments inital med review completed.  ITP-continue with ITP 30 day review New to program  Continue with ITP         Comments:

## 2016-03-03 NOTE — Progress Notes (Signed)
Daily Session Note  Patient Details  Name: Brian Waters MRN: [id number] Date of Birth: 1961/05/18 Referring Provider:    Encounter Date: 03/03/2016  Check In:     Session Check In - 03/03/16 0843    Check-In   Location [facility]-Cardiac & Pulmonary Rehab   Staff Present Nyoka Cowden, RN;Quincy Prisco, RN, BSN, CCRP;Rebecca Sickles, DPT, CEEA   Supervising physician immediately available to respond to emergencies See telemetry face sheet for immediately available ER MD   Medication changes reported     No   Fall or balance concerns reported    No   Warm-up and Cool-down Performed on first and last piece of equipment   VAD Patient? No   Pain Assessment   Currently in Pain? No/denies         Goals Met:  Exercise tolerated well Personal goals reviewed No report of cardiac concerns or symptoms Strength training completed today  Goals Unmet:  Not Applicable  Comments: First session.   Dr. Emily Filbert is Medical Director for [place] and [name].

## 2016-03-05 ENCOUNTER — Encounter: Payer: Federal, State, Local not specified - PPO | Admitting: *Deleted

## 2016-03-05 DIAGNOSIS — I214 Non-ST elevation (NSTEMI) myocardial infarction: Secondary | ICD-10-CM | POA: Diagnosis not present

## 2016-03-05 DIAGNOSIS — Z9861 Coronary angioplasty status: Secondary | ICD-10-CM

## 2016-03-05 NOTE — Progress Notes (Signed)
Daily Session Note  Patient Details  Name: Brian Waters MRN: [id number] Date of Birth: 02-Feb-1961 Referring Provider:    Encounter Date: 03/05/2016  Check In:     Session Check In - 03/05/16 0912    Check-In   Staff Present Nyoka Cowden, RN;Vandora Jaskulski, RN, BSN, CCRP;Rebecca Sickles, DPT, [place] physician immediately available to respond to emergencies See telemetry face sheet for immediately available ER MD   Medication changes reported     No   Fall or balance concerns reported    No   Warm-up and Cool-down Performed on first and last piece of equipment   VAD Patient? No   Pain Assessment   Currently in Pain? No/denies         Goals Met:  Exercise tolerated well No report of cardiac concerns or symptoms Strength training completed today  Goals Unmet:  Not Applicable  Comments: Doing well with exercise prescription progression.    Dr. Emily Filbert is Medical Director for [place] and [name].

## 2016-03-08 ENCOUNTER — Encounter: Payer: Federal, State, Local not specified - PPO | Attending: Cardiovascular Disease | Admitting: *Deleted

## 2016-03-08 DIAGNOSIS — Z9861 Coronary angioplasty status: Secondary | ICD-10-CM | POA: Diagnosis present

## 2016-03-08 DIAGNOSIS — I214 Non-ST elevation (NSTEMI) myocardial infarction: Secondary | ICD-10-CM

## 2016-03-08 NOTE — Progress Notes (Signed)
Daily Session Note  Patient Details  Name: TRAVONTA GILL MRN: [id number] Date of Birth: [birth year] Referring Provider:    Encounter Date: 03/08/2016  Check In:     Session Check In - 03/08/16 1027    Check-In   Staff Present Heath Lark, RN, BSN, CCRP;Carroll Enterkin, RN, Moises Blood, BS, ACSM CEP, Exercise Physiologist   Supervising physician immediately available to respond to emergencies See telemetry face sheet for immediately available ER MD   Medication changes reported     No   Fall or balance concerns reported    No   Warm-up and Cool-down Performed on first and last piece of equipment   VAD Patient? No   Pain Assessment   Currently in Pain? No/denies         Goals Met:  Exercise tolerated well No report of cardiac concerns or symptoms  Goals Unmet:  Not Applicable  Comments: Doing well with exercise prescription progression.    Dr. Emily Filbert is Medical Director for [place] and [name].

## 2016-03-10 ENCOUNTER — Encounter: Payer: Federal, State, Local not specified - PPO | Admitting: *Deleted

## 2016-03-10 DIAGNOSIS — Z9861 Coronary angioplasty status: Secondary | ICD-10-CM

## 2016-03-10 DIAGNOSIS — I214 Non-ST elevation (NSTEMI) myocardial infarction: Secondary | ICD-10-CM | POA: Diagnosis not present

## 2016-03-10 NOTE — Progress Notes (Signed)
Daily Session Note  Patient Details  Name: Brian Waters MRN: [id number] Date of Birth: 08/29/1961 Referring Provider:    Encounter Date: 03/10/2016  Check In:     Session Check In - 03/10/16 0829    Check-In   Location [facility]-Cardiac & Pulmonary Rehab   Staff Present Heath Lark, RN, BSN, CCRP;Carroll Enterkin, RN, Jana Half, RN, BSN   Supervising physician immediately available to respond to emergencies See telemetry face sheet for immediately available ER MD   Medication changes reported     No   Fall or balance concerns reported    No   Warm-up and Cool-down Performed on first and last piece of equipment   Resistance Training Performed Yes   VAD Patient? No   Pain Assessment   Currently in Pain? No/denies         Goals Met:  Proper associated with RPD/PD & O2 Sat Exercise tolerated well No report of cardiac concerns or symptoms Strength training completed today  Goals Unmet:  Not Applicable  Comments:  Patient completed exercise prescription and all exercise goals during rehab session. The exercise was tolerated well and the patient is progressing in the program.    Dr. Emily Filbert is Medical Director for [place] and [name].

## 2016-03-12 DIAGNOSIS — Z9861 Coronary angioplasty status: Secondary | ICD-10-CM

## 2016-03-12 DIAGNOSIS — I214 Non-ST elevation (NSTEMI) myocardial infarction: Secondary | ICD-10-CM

## 2016-03-12 NOTE — Progress Notes (Signed)
Daily Session Note  Patient Details  Name: OLIVIER FRAYRE MRN: [id number] Date of Birth: Mar 24, 1961 Referring Provider:    Encounter Date: 03/12/2016  Check In:     Session Check In - 03/12/16 0948    Check-In   Location [facility]-Cardiac & Pulmonary Rehab   Staff Present Heath Lark, RN, BSN, CCRP;Carroll Enterkin, RN, Alex Gardener, DPT, CEEA   Supervising physician immediately available to respond to emergencies See telemetry face sheet for immediately available ER MD   Medication changes reported     No   Fall or balance concerns reported    No   Warm-up and Cool-down Performed on first and last piece of equipment   Resistance Training Performed Yes   VAD Patient? No         Goals Met:  Independence with exercise equipment Exercise tolerated well No report of cardiac concerns or symptoms Strength training completed today  Goals Unmet:  Not Applicable  Comments: Patient completed exercise prescription and all exercise goals during rehab session. The exercise was tolerated well and the patient is progressing in the program.    Dr. Emily Filbert is Medical Director for [place] and [name].

## 2016-03-15 ENCOUNTER — Encounter: Payer: Federal, State, Local not specified - PPO | Admitting: *Deleted

## 2016-03-15 DIAGNOSIS — Z9861 Coronary angioplasty status: Secondary | ICD-10-CM

## 2016-03-15 DIAGNOSIS — I214 Non-ST elevation (NSTEMI) myocardial infarction: Secondary | ICD-10-CM

## 2016-03-15 NOTE — Progress Notes (Signed)
Daily Session Note  Patient Details  Name: Brian Waters MRN: [id number] Date of Birth: 03/07/1961 Referring Provider:    Encounter Date: 03/15/2016  Check In:     Session Check In - 03/15/16 0842    Check-In   Location [facility]-Cardiac & Pulmonary Rehab   Staff Present Heath Lark, RN, BSN, CCRP;Carroll Enterkin, RN, Moises Blood, BS, ACSM CEP, Exercise Physiologist;Other   Supervising physician immediately available to respond to emergencies See telemetry face sheet for immediately available ER MD   Medication changes reported     No   Fall or balance concerns reported    No   Warm-up and Cool-down Performed on first and last piece of equipment   Resistance Training Performed Yes   VAD Patient? No   Pain Assessment   Currently in Pain? No/denies   Multiple Pain Sites No         Goals Met:  Independence with exercise equipment Exercise tolerated well No report of cardiac concerns or symptoms Strength training completed today  Goals Unmet:  Not Applicable  Comments: Patient completed exercise prescription and all exercise goals during rehab session. The exercise was tolerated well and the patient is progressing in the program.     Dr. Emily Filbert is Medical Director for [place] and [name].

## 2016-03-17 ENCOUNTER — Encounter: Payer: Federal, State, Local not specified - PPO | Admitting: *Deleted

## 2016-03-17 DIAGNOSIS — I214 Non-ST elevation (NSTEMI) myocardial infarction: Secondary | ICD-10-CM

## 2016-03-17 DIAGNOSIS — Z9861 Coronary angioplasty status: Secondary | ICD-10-CM

## 2016-03-17 NOTE — Progress Notes (Signed)
Daily Session Note  Patient Details  Name: TUFF CLABO MRN: [id number] Date of Birth: [birth year] Referring Provider:    Encounter Date: 03/17/2016  Check In:     Session Check In - 03/17/16 0909    Check-In   Staff Present Heath Lark, RN, BSN, CCRP;Laureen Owens Shark, BS, RRT, Respiratory Therapist;Carroll Enterkin, RN, BSN   Supervising physician immediately available to respond to emergencies See telemetry face sheet for immediately available ER MD   Medication changes reported     No   Fall or balance concerns reported    No   Warm-up and Cool-down Performed on first and last piece of equipment   VAD Patient? No   Pain Assessment   Currently in Pain? No/denies           Exercise Prescription Changes - 03/17/16 0700    Exercise Review   Progression Yes   Response to Exercise   Blood Pressure (Admit) 138/78 mmHg   Blood Pressure (Exercise) 152/70 mmHg   Blood Pressure (Exit) 132/82 mmHg   Heart Rate (Admit) 85 bpm   Heart Rate (Exercise) 145 bpm  Exercise HR ranged from 99 (REL)-145(AD)   Heart Rate (Exit) 92 bpm   Symptoms none   Duration Progress to 45 minutes of aerobic exercise without signs/symptoms of physical distress   Intensity Rest + 40   Progression   Progression Continue progressive overload as per policy without signs/symptoms or physical distress.   Resistance Training   Training Prescription Yes   Weight 3   Reps 10-15  Pt recently had left shoulder surgery.     Treadmill   MPH 3   Grade 1   Minutes 15   Bike   Level 1.6  AD bike    Minutes 10   REL-XR   Level 4   Watts 80   Minutes 15      Goals Met:  Independence with exercise equipment Exercise tolerated well Personal goals reviewed No report of cardiac concerns or symptoms Strength training completed today  Goals Unmet:  Not Applicable  Comments: Doing well with exercise prescription progression.    Dr. Emily Filbert is Medical Director for [place] [name].

## 2016-03-19 ENCOUNTER — Encounter: Payer: Federal, State, Local not specified - PPO | Admitting: *Deleted

## 2016-03-19 DIAGNOSIS — Z9861 Coronary angioplasty status: Secondary | ICD-10-CM

## 2016-03-19 DIAGNOSIS — I214 Non-ST elevation (NSTEMI) myocardial infarction: Secondary | ICD-10-CM | POA: Diagnosis not present

## 2016-03-19 NOTE — Progress Notes (Signed)
Daily Session Note  Patient Details  Name: Brian Waters MRN: [id number] Date of Birth: [birth year] Referring Provider:    Encounter Date: 03/19/2016  Check In:     Session Check In - 03/19/16 0937    Check-In   Location [facility]-Cardiac & Pulmonary Rehab   Staff Present Heath Lark, RN, BSN, CCRP;Whisper Kurka, RN, Moises Blood, BS, ACSM CEP, Exercise Physiologist   Supervising physician immediately available to respond to emergencies See telemetry face sheet for immediately available ER MD   Medication changes reported     No   Fall or balance concerns reported    No   Warm-up and Cool-down Performed on first and last piece of equipment   Resistance Training Performed Yes   VAD Patient? No   Pain Assessment   Currently in Pain? No/denies         Goals Met:  Proper associated with RPD/PD & O2 Sat Exercise tolerated well  Goals Unmet:  Not Applicable  Comments:     Dr. Emily Filbert is Medical Director for [place] and [name].

## 2016-03-28 ENCOUNTER — Encounter: Payer: Self-pay | Admitting: *Deleted

## 2016-03-28 DIAGNOSIS — Z9861 Coronary angioplasty status: Secondary | ICD-10-CM

## 2016-03-28 DIAGNOSIS — I214 Non-ST elevation (NSTEMI) myocardial infarction: Secondary | ICD-10-CM

## 2016-03-28 NOTE — Progress Notes (Signed)
Cardiac Individual Treatment Plan  Patient Details  Name: Brian Waters MRN: [id number] Date of Birth: 1961-04-24 Referring Provider:    Initial Encounter Date:       Cardiac Rehab from 03/01/2016 in [facility] Cardiac and Pulmonary Rehab   Date  03/01/16      Visit Diagnosis: S/P PTCA (percutaneous transluminal coronary angioplasty)  NSTEMI (non-ST elevated myocardial infarction) [facility])  Patient's Home Medications on Admission:  Current outpatient prescriptions:  .  aspirin EC 81 MG tablet, Take 1 tablet (81 mg total) by mouth daily., Disp: 60 tablet, Rfl:  .  atorvastatin (LIPITOR) 40 MG tablet, Take 1 tablet (40 mg total) by mouth daily at 6 PM., Disp: 60 tablet, Rfl: 1 .  fluticasone (FLONASE) 50 MCG/ACT nasal spray, Place 2 sprays into both nostrils daily. , Disp: , Rfl:  .  loratadine (CLARITIN) 10 MG tablet, Take 10 mg by mouth daily., Disp: , Rfl:  .  metoprolol tartrate (LOPRESSOR) 25 MG tablet, Take 0.5 tablets (12.5 mg total) by mouth 2 (two) times daily. (Patient not taking: Reported on 03/01/2016), Disp: 60 tablet, Rfl: 1 .  Multiple Vitamin (MULTIVITAMIN) tablet, Take 1 tablet by mouth daily., Disp: , Rfl:  .  naproxen sodium (ANAPROX) 220 MG tablet, Take 220 mg by mouth 2 (two) times daily., Disp: , Rfl:  .  ticagrelor (BRILINTA) 90 MG TABS tablet, Take 1 tablet (90 mg total) by mouth 2 (two) times daily., Disp: 60 tablet, Rfl: 1  Past Medical History: Past Medical History  Diagnosis Date  . Seasonal allergies     Tobacco Use: History  Smoking status  . Never Smoker   Smokeless tobacco  . Not on file    Labs: Recent Review Flowsheet Data    There is no flowsheet data to display.       Exercise Target Goals:    Exercise Program Goal: Individual exercise prescription set with THRR, safety & activity barriers. Participant demonstrates ability to understand and report RPE using BORG scale, to self-measure pulse accurately, and to acknowledge the importance  of the exercise prescription.  Exercise Prescription Goal: Starting with aerobic activity 30 plus minutes a day, 3 days per week for initial exercise prescription. Provide home exercise prescription and guidelines that participant acknowledges understanding prior to discharge.  Activity Barriers & Risk Stratification:     Activity Barriers & Cardiac Risk Stratification - 03/01/16 1441    Activity Barriers & Cardiac Risk Stratification   Cardiac Risk Stratification Moderate      6 Minute Walk:     6 Minute Walk      03/01/16 1523       6 Minute Walk   Phase Initial     Distance 1970 feet     Walk Time 6 minutes     MPH 3.7     RPE 13     Resting HR 69 bpm     Resting BP 126/64 mmHg     Max Ex. HR 112 bpm     Max Ex. BP 140/76 mmHg        Initial Exercise Prescription:     Initial Exercise Prescription - 03/01/16 1500    Date of Initial Exercise RX and Referring Provider   Date 03/01/16   Treadmill   MPH 3   Grade 0   Minutes 15   Bike   Level 1   Minutes 15   Recumbant Bike   Level 3   Watts 30   Minutes 15  NuStep   Level 3   Watts 50   Minutes 15   Cybex   Level 2   RPM 50   Minutes 15   Recumbant Elliptical   Level 2   Watts 20   Minutes 15   Elliptical   Level 1   Speed 3   Minutes 15   REL-XR   Level 2   Watts 60   Minutes 15   T5 Nustep   Level 2   Watts 50   Minutes 15   Biostep-RELP   Level 4   Watts 50   Minutes 15   Prescription Details   Frequency (times per week) 3   Duration Progress to 45 minutes of aerobic exercise without signs/symptoms of physical distress   Intensity   THRR REST +  30   Ratings of Perceived Exertion 11-13   Perceived Dyspnea 0-4   Progression   Progression Continue to progress workloads to maintain intensity without signs/symptoms of physical distress.   Resistance Training   Training Prescription Yes   Weight 3   Reps 10-15  Pt recently had left shoulder surgery.        Perform  Capillary Blood Glucose checks as needed.  Exercise Prescription Changes:     Exercise Prescription Changes      03/01/16 1500 03/17/16 0700         Exercise Review   Progression  Yes      Response to Exercise   Blood Pressure (Admit) 126/64 mmHg 138/78 mmHg      Blood Pressure (Exercise) 140/76 mmHg 152/70 mmHg      Blood Pressure (Exit) 126/84 mmHg 132/82 mmHg      Heart Rate (Admit) 76 bpm 85 bpm      Heart Rate (Exercise) 113 bpm 145 bpm  Exercise HR ranged from 99 (REL)-145(AD)      Heart Rate (Exit) 75 bpm 92 bpm      Symptoms  none      Duration  Progress to 45 minutes of aerobic exercise without signs/symptoms of physical distress      Intensity  Rest + 40      Progression   Progression  Continue progressive overload as per policy without signs/symptoms or physical distress.      Resistance Training   Training Prescription  Yes      Weight  3      Reps  10-15  Pt recently had left shoulder surgery.        Treadmill   MPH  3      Grade  1      Minutes  15      Bike   Level  1.6  AD bike       Minutes  10      REL-XR   Level  4      Watts  80      Minutes  15         Exercise Comments:     Exercise Comments      03/03/16 0843 03/17/16 0718 03/17/16 0719       Exercise Comments First day of exercise!Brian Waters was oriented to the gym and the equipment functions and settings. Procedures and policies of the gym were outlined and explained. The patient's individual exercise prescription and treatment plan were reviewed with him. All starting workloads were established based on the results of the functional testing  done at the initial intake visit. The plan for exercise progression  was also introduced and progression will be customized based on the patient's performance and goals.  Reviewed individualized exercise prescription and made increases per departmental policy. Exercise increases were discussed with the patient and they were able to perform the new work  loads without issue (no signs or symptoms).  Brian Waters has gotten off to a great start with his exercise program. He has tolerated his exercise well and has shown increases in his strengh and stamina by increasing his intensities and duration of exercise. His THRR was increased to rest +40 to accomodate his ability to challenge himself with exercise.         Discharge Exercise Prescription (Final Exercise Prescription Changes):     Exercise Prescription Changes - 03/17/16 0700    Exercise Review   Progression Yes   Response to Exercise   Blood Pressure (Admit) 138/78 mmHg   Blood Pressure (Exercise) 152/70 mmHg   Blood Pressure (Exit) 132/82 mmHg   Heart Rate (Admit) 85 bpm   Heart Rate (Exercise) 145 bpm  Exercise HR ranged from 99 (REL)-145(AD)   Heart Rate (Exit) 92 bpm   Symptoms none   Duration Progress to 45 minutes of aerobic exercise without signs/symptoms of physical distress   Intensity Rest + 40   Progression   Progression Continue progressive overload as per policy without signs/symptoms or physical distress.   Resistance Training   Training Prescription Yes   Weight 3   Reps 10-15  Pt recently had left shoulder surgery.     Treadmill   MPH 3   Grade 1   Minutes 15   Bike   Level 1.6  AD bike    Minutes 10   REL-XR   Level 4   Watts 80   Minutes 15      Nutrition:  Target Goals: Understanding of nutrition guidelines, daily intake of sodium <1542m, cholesterol <2036m calories 30% from fat and 7% or less from saturated fats, daily to have 5 or more servings of fruits and vegetables.  Biometrics:     Pre Biometrics - 03/01/16 1524    Pre Biometrics   Height '5\' 10"'  (1.778 m)   Weight 195 lb 1.6 oz (88.497 kg)   Waist Circumference 40.25 inches   Hip Circumference 43.5 inches   Waist to Hip Ratio 0.93 %   BMI (Calculated) 28.1       Nutrition Therapy Plan and Nutrition Goals:     Nutrition Therapy & Goals - 03/01/16 1359    Intervention Plan    Intervention Prescribe, educate and counsel regarding individualized specific dietary modifications aiming towards targeted core components such as weight, hypertension, lipid management, diabetes, heart failure and other comorbidities.   Expected Outcomes Short Term Goal: Understand basic principles of dietary content, such as calories, fat, sodium, cholesterol and nutrients.;Short Term Goal: A plan has been developed with personal nutrition goals set during dietitian appointment.;Long Term Goal: Adherence to prescribed nutrition plan.      Nutrition Discharge: Rate Your Plate Scores:   Nutrition Goals Re-Evaluation:   Psychosocial: Target Goals: Acknowledge presence or absence of depression, maximize coping skills, provide positive support system. Participant is able to verbalize types and ability to use techniques and skills needed for reducing stress and depression.  Initial Review & Psychosocial Screening:     Initial Psych Review & Screening - 03/01/16 1437    Initial Review   Current issues with Current Stress Concerns  Stress issiues with work and family. Overwhelmed with current medical diagnosis.  Has stated  he took time off this week and that time away form work has helped reduce the stress feelings.    Source of Stress Concerns Occupation   Comments One child in college and one in high school.   Another graduated from college.    Family Dynamics   Good Support System? Yes  Wife and children   Barriers   Psychosocial barriers to participate in program There are no identifiable barriers or psychosocial needs.;The patient should benefit from training in stress management and relaxation.   Screening Interventions   Interventions Encouraged to exercise      Quality of Life Scores:     Quality of Life - 03/01/16 1540    Quality of Life Scores   Health/Function Pre 19.57 %   Socioeconomic Pre 23.42 %   Psych/Spiritual Pre 21.07 %   Family Pre 27.1 %   GLOBAL Pre 21.73  %      PHQ-9:     Recent Review Flowsheet Data    Depression screen [facility] 2/9 03/01/2016   Decreased Interest 1   Down, Depressed, Hopeless 1   PHQ - 2 Score 2   Altered sleeping 1   Tired, decreased energy 1   Change in appetite 1   Feeling bad or failure about yourself  1   Trouble concentrating 1   Moving slowly or fidgety/restless 1   Suicidal thoughts 0   PHQ-9 Score 8   Difficult doing work/chores Somewhat difficult       Psychosocial Evaluation and Intervention:     Psychosocial Evaluation - 03/03/16 1005    Psychosocial Evaluation & Interventions   Interventions Stress management education;Relaxation education;Encouraged to exercise with the program and follow exercise prescription   Comments Counselor met with Mr. Mccue today for initial psychosocial evaluation.  He is a 55 year old who has a strong family history of heart disease and recently had an incident himself.  He has a strong support system with a spouse of 27 years and several adult children.  He is also actively involved in his local church community.  Mr. Hillyard had shoulder surgery in January so now he is recovering from this as well.  He denies sleeping well (maybe interrupted 5-6 hours per night) and denies a history of depression.  Although he admits to some anxiety symptoms currently with job stress in particular contributing to this.  He states his mood is generally positive.  Mr. Lumadue has goals to be educated on his limitations of exercise and to increase his stamina and strength while in this program.  Counselor made some recommendations for stress management with deep breathing practiced and consistent exercise.  Counselor also suggested checking with his doctor or pharmacist to see if an OTC sleep aid might be helpful during this time.     Continued Psychosocial Services Needed Yes  Mr. Devera is recommended to practice deep breathing and consistent exercise to help with stress management.  In  addition, he will benefit from that psychoeducational component of this program.  Counselor will follow with him re: these suggestions.       Psychosocial Re-Evaluation:   Vocational Rehabilitation: Provide vocational rehab assistance to qualifying candidates.   Vocational Rehab Evaluation & Intervention:     Vocational Rehab - 03/01/16 1401    Initial Vocational Rehab Evaluation & Intervention   Assessment shows need for Vocational Rehabilitation No      Education: Education Goals: Education classes will be provided on a weekly basis, covering  required topics. Participant will state understanding/return demonstration of topics presented.  Learning Barriers/Preferences:     Learning Barriers/Preferences - 03/01/16 1441    Learning Barriers/Preferences   Learning Barriers None   Learning Preferences Computer/Internet;Individual Instruction;Video      Education Topics: General Nutrition Guidelines/Fats and Fiber: -Group instruction provided by verbal, written material, models and posters to present the general guidelines for heart healthy nutrition. Gives an explanation and review of dietary fats and fiber.   Controlling Sodium/Reading Food Labels: -Group verbal and written material supporting the discussion of sodium use in heart healthy nutrition. Review and explanation with models, verbal and written materials for utilization of the food label.   Exercise Physiology & Risk Factors: - Group verbal and written instruction with models to review the exercise physiology of the cardiovascular system and associated critical values. Details cardiovascular disease risk factors and the goals associated with each risk factor.   Aerobic Exercise & Resistance Training: - Gives group verbal and written discussion on the health impact of inactivity. On the components of aerobic and resistive training programs and the benefits of this training and how to safely progress through these  programs.   Flexibility, Balance, General Exercise Guidelines: - Provides group verbal and written instruction on the benefits of flexibility and balance training programs. Provides general exercise guidelines with specific guidelines to those with heart or lung disease. Demonstration and skill practice provided.          Cardiac Rehab from 03/15/2016 in [facility] Cardiac and Pulmonary Rehab   Date  03/03/16   Educator  bs   Instruction Review Code  2- meets goals/outcomes      Stress Management: - Provides group verbal and written instruction about the health risks of elevated stress, cause of high stress, and healthy ways to reduce stress.      Cardiac Rehab from 03/15/2016 in [facility] Cardiac and Pulmonary Rehab   Date  03/10/16   Educator  [facility]   Instruction Review Code  2- meets goals/outcomes      Depression: - Provides group verbal and written instruction on the correlation between heart/lung disease and depressed mood, treatment options, and the stigmas associated with seeking treatment.   Anatomy & Physiology of the Heart: - Group verbal and written instruction and models provide basic cardiac anatomy and physiology, with the coronary electrical and arterial systems. Review of: AMI, Angina, Valve disease, Heart Failure, Cardiac Arrhythmia, Pacemakers, and the ICD.      Cardiac Rehab from 03/15/2016 in [facility] Cardiac and Pulmonary Rehab   Date  03/08/16   Educator  SB   Instruction Review Code  2- meets goals/outcomes      Cardiac Procedures: - Group verbal and written instruction and models to describe the testing methods done to diagnose heart disease. Reviews the outcomes of the test results. Describes the treatment choices: Medical Management, Angioplasty, or Coronary Bypass Surgery.      Cardiac Rehab from 03/15/2016 in [facility] Cardiac and Pulmonary Rehab   Date  03/15/16   Educator  CE   Instruction Review Code  2- meets goals/outcomes      Cardiac Medications: - Group verbal and  written instruction to review commonly prescribed medications for heart disease. Reviews the medication, class of the drug, and side effects. Includes the steps to properly store meds and maintain the prescription regimen.   Go Sex-Intimacy & Heart Disease, Get SMART - Goal Setting: - Group verbal and written instruction through game format to discuss heart disease and  the return to sexual intimacy. Provides group verbal and written material to discuss and apply goal setting through the application of the S.M.A.R.T. Method.      Cardiac Rehab from 03/15/2016 in [facility] Cardiac and Pulmonary Rehab   Date  03/15/16   Educator  CE   Instruction Review Code  2- meets goals/outcomes      Other Matters of the Heart: - Provides group verbal, written materials and models to describe Heart Failure, Angina, Valve Disease, and Diabetes in the realm of heart disease. Includes description of the disease process and treatment options available to the cardiac patient.      Cardiac Rehab from 03/15/2016 in [facility] Cardiac and Pulmonary Rehab   Date  03/08/16   Educator  SB   Instruction Review Code  2- meets goals/outcomes      Exercise & Equipment Safety: - Individual verbal instruction and demonstration of equipment use and safety with use of the equipment.      Cardiac Rehab from 03/15/2016 in [facility] Cardiac and Pulmonary Rehab   Date  03/01/16   Educator  SB   Instruction Review Code  2- meets goals/outcomes      Infection Prevention: - Provides verbal and written material to individual with discussion of infection control including proper hand washing and proper equipment cleaning during exercise session.      Cardiac Rehab from 03/15/2016 in [facility] Cardiac and Pulmonary Rehab   Date  03/01/16   Educator  SB   Instruction Review Code  2- meets goals/outcomes      Falls Prevention: - Provides verbal and written material to individual with discussion of falls prevention and safety.      Cardiac Rehab from  03/15/2016 in [facility] Cardiac and Pulmonary Rehab   Date  03/01/16   Educator  SB   Instruction Review Code  2- meets goals/outcomes      Diabetes: - Individual verbal and written instruction to review signs/symptoms of diabetes, desired ranges of glucose level fasting, after meals and with exercise. Advice that pre and post exercise glucose checks will be done for 3 sessions at entry of program.    Knowledge Questionnaire Score:     Knowledge Questionnaire Score - 03/01/16 1447    Knowledge Questionnaire Score   Pre Score 22/28      Core Components/Risk Factors/Patient Goals at Admission:     Personal Goals and Risk Factors at Admission - 03/01/16 1435    Core Components/Risk Factors/Patient Goals on Admission   Sedentary Yes   Intervention Provide advice, education, support and counseling about physical activity/exercise needs.;Develop an individualized exercise prescription for aerobic and resistive training based on initial evaluation findings, risk stratification, comorbidities and participant's personal goals.   Expected Outcomes Achievement of increased cardiorespiratory fitness and enhanced flexibility, muscular endurance and strength shown through measurements of functional capacity and personal statement of participant.   Stress Yes  Stress more work related.  Brian Waters has talked to his boss about making changes to reduce the work stress.   Intervention Offer individual and/or small group education and counseling on adjustment to heart disease, stress management and health-related lifestyle change. Teach and support self-help strategies.;Refer participants experiencing significant psychosocial distress to appropriate mental health specialists for further evaluation and treatment. When possible, include family members and significant others in education/counseling sessions.   Expected Outcomes Short Term: Participant demonstrates changes in health-related behavior, relaxation and  other stress management skills, ability to obtain effective social support, and compliance with psychotropic medications if  prescribed.;Long Term: Emotional wellbeing is indicated by absence of clinically significant psychosocial distress or social isolation.      Core Components/Risk Factors/Patient Goals Review:    Core Components/Risk Factors/Patient Goals at Discharge (Final Review):    ITP Comments:     ITP Comments      03/01/16 1359 03/03/16 0657 03/28/16 1252       ITP Comments inital med review completed.  ITP-continue with ITP 30 day review New to program  Continue with ITP 30 day review. Continue with ITP        Comments:

## 2016-03-29 ENCOUNTER — Encounter: Payer: Federal, State, Local not specified - PPO | Admitting: *Deleted

## 2016-03-29 DIAGNOSIS — I214 Non-ST elevation (NSTEMI) myocardial infarction: Secondary | ICD-10-CM | POA: Diagnosis not present

## 2016-03-29 DIAGNOSIS — Z9861 Coronary angioplasty status: Secondary | ICD-10-CM

## 2016-03-29 NOTE — Progress Notes (Signed)
Daily Session Note  Patient Details  Name: Brian Waters MRN: [id number] Date of Birth: 14-May-1961 Referring Provider:    Encounter Date: 03/29/2016  Check In:     Session Check In - 03/29/16 0847    Check-In   Location [facility]-Cardiac & Pulmonary Rehab   Staff Present Nyoka Cowden, RN;Carroll Enterkin, RN, Moises Blood, BS, ACSM CEP, Exercise Physiologist   Supervising physician immediately available to respond to emergencies See telemetry face sheet for immediately available ER MD   Medication changes reported     No   Fall or balance concerns reported    No   Warm-up and Cool-down Performed on first and last piece of equipment   Resistance Training Performed Yes   VAD Patient? No   Pain Assessment   Currently in Pain? No/denies   Multiple Pain Sites No         Goals Met:  Independence with exercise equipment Exercise tolerated well No report of cardiac concerns or symptoms Strength training completed today  Goals Unmet:  Not Applicable  Comments: Patient completed exercise prescription and all exercise goals during rehab session. The exercise was tolerated well and the patient is progressing in the program.     Dr. Emily Filbert is Medical Director for [place] and [name].

## 2016-03-31 ENCOUNTER — Encounter: Payer: Federal, State, Local not specified - PPO | Admitting: *Deleted

## 2016-03-31 DIAGNOSIS — Z9861 Coronary angioplasty status: Secondary | ICD-10-CM

## 2016-03-31 DIAGNOSIS — I214 Non-ST elevation (NSTEMI) myocardial infarction: Secondary | ICD-10-CM

## 2016-03-31 NOTE — Progress Notes (Signed)
Daily Session Note  Patient Details  Name: Brian Waters MRN: [id number] Date of Birth: 06/25/1961 Referring Provider:    Encounter Date: 03/31/2016  Check In:     Session Check In - 03/31/16 0943    Check-In   Location [facility]-Cardiac & Pulmonary Rehab   Staff Present Nyoka Cowden, RN;Darald Uzzle Luan Pulling, MA, ACSM RCEP, Exercise Physiologist;Amanda Oletta Darter, BA, ACSM CEP, Exercise Physiologist;Diane Joya Gaskins, RN, BSN;Susanne Bice, RN, BSN, [employer] physician immediately available to respond to emergencies See telemetry face sheet for immediately available ER MD   Medication changes reported     No   Fall or balance concerns reported    No   Warm-up and Cool-down Performed on first and last piece of equipment   Resistance Training Performed Yes   VAD Patient? No   Pain Assessment   Currently in Pain? No/denies   Multiple Pain Sites No         Goals Met:  Independence with exercise equipment Exercise tolerated well No report of cardiac concerns or symptoms Strength training completed today  Goals Unmet:  Not Applicable  Comments: Pt able to follow exercise prescription today without complaint.  Will continue to monitor for progression.    Dr. Emily Filbert is Medical Director for [place] and [name].

## 2016-04-02 ENCOUNTER — Encounter: Payer: Federal, State, Local not specified - PPO | Admitting: *Deleted

## 2016-04-02 DIAGNOSIS — I214 Non-ST elevation (NSTEMI) myocardial infarction: Secondary | ICD-10-CM | POA: Diagnosis not present

## 2016-04-02 DIAGNOSIS — Z9861 Coronary angioplasty status: Secondary | ICD-10-CM

## 2016-04-02 NOTE — Progress Notes (Signed)
Daily Session Note  Patient Details  Name: Brian Waters MRN: [id number] Date of Birth: [birth year] Referring Provider:    Encounter Date: 04/02/2016  Check In:     Session Check In - 04/02/16 0934    Check-In   Location [facility]   Staff Present Heath Lark, RN, BSN, CCRP;Llewelyn Sheaffer, RN, Levie Heritage, MA, ACSM RCEP, Exercise Physiologist   Supervising physician immediately available to respond to emergencies See telemetry face sheet for immediately available ER MD   Medication changes reported     No   Fall or balance concerns reported    No   Warm-up and Cool-down Performed on first and last piece of equipment   Resistance Training Performed Yes   VAD Patient? No   Pain Assessment   Currently in Pain? No/denies         Goals Met:  Proper associated with RPD/PD & O2 Sat Exercise tolerated well  Goals Unmet:  Not Applicable  Comments:     Dr. Emily Filbert is Medical Director for [place] and [name].

## 2016-04-07 ENCOUNTER — Encounter: Payer: Federal, State, Local not specified - PPO | Admitting: *Deleted

## 2016-04-07 DIAGNOSIS — I214 Non-ST elevation (NSTEMI) myocardial infarction: Secondary | ICD-10-CM | POA: Diagnosis not present

## 2016-04-07 DIAGNOSIS — Z9861 Coronary angioplasty status: Secondary | ICD-10-CM

## 2016-04-07 NOTE — Progress Notes (Signed)
Daily Session Note  Patient Details  Name: Brian Waters MRN: [id number] Date of Birth: 12/31/1960 Referring Provider:    Encounter Date: 04/07/2016  Check In:     Session Check In - 04/07/16 0839    Check-In   Location [facility]   Staff Present Nyoka Cowden, RN;Amanda Oletta Darter, BA, ACSM CEP, Exercise Physiologist;Dallis Czaja Luan Pulling, MA, ACSM RCEP, Exercise Physiologist;Diane Joya Gaskins, RN, BSN   Supervising physician immediately available to respond to emergencies See telemetry face sheet for immediately available ER MD   Medication changes reported     No   Fall or balance concerns reported    No   Warm-up and Cool-down Performed on first and last piece of equipment   Resistance Training Performed Yes   VAD Patient? No   Pain Assessment   Currently in Pain? No/denies   Multiple Pain Sites No         Goals Met:  Independence with exercise equipment Exercise tolerated well No report of cardiac concerns or symptoms Strength training completed today  Goals Unmet:  Not Applicable  Comments: Pt able to follow exercise prescription today without complaint.  Will continue to monitor for progression.    Dr. Emily Filbert is Medical Director for [place] and [name].

## 2016-04-09 ENCOUNTER — Encounter: Payer: Federal, State, Local not specified - PPO | Attending: Cardiovascular Disease | Admitting: *Deleted

## 2016-04-09 DIAGNOSIS — I214 Non-ST elevation (NSTEMI) myocardial infarction: Secondary | ICD-10-CM | POA: Insufficient documentation

## 2016-04-09 DIAGNOSIS — Z9861 Coronary angioplasty status: Secondary | ICD-10-CM | POA: Insufficient documentation

## 2016-04-09 NOTE — Progress Notes (Addendum)
Daily Session Note  Patient Details  Name: Brian Waters MRN: [id number] Date of Birth: 02-21-1961 Referring Provider:    Encounter Date: 04/09/2016  Check In:     Session Check In - 04/09/16 0916    Check-In   Staff Present Heath Lark, RN, BSN, CCRP;Carroll Enterkin, RN, BSN;Stacey Blanch Media, RRT, RCP, Respiratory Lennie Hummer, MA, ACSM RCEP, Exercise Physiologist   Supervising physician immediately available to respond to emergencies See telemetry face sheet for immediately available ER MD   Medication changes reported     No   Fall or balance concerns reported    No   Warm-up and Cool-down Performed on first and last piece of equipment   VAD Patient? No   Pain Assessment   Currently in Pain? No/denies         Goals Met:  Independence with exercise equipment Exercise tolerated well No report of cardiac concerns or symptoms Strength training completed today  Goals Unmet:  Not Applicable  Comments: Doing well with exercise prescription progression.  Reviewed individual exercise prescription with patient.  Changes were noted and patient was able to perform at new work load without signs or symptoms.    Dr. Emily Filbert is Medical Director for [place] and [name].

## 2016-04-12 ENCOUNTER — Encounter: Payer: Federal, State, Local not specified - PPO | Admitting: *Deleted

## 2016-04-12 DIAGNOSIS — Z9861 Coronary angioplasty status: Secondary | ICD-10-CM

## 2016-04-12 DIAGNOSIS — I214 Non-ST elevation (NSTEMI) myocardial infarction: Secondary | ICD-10-CM | POA: Diagnosis not present

## 2016-04-12 NOTE — Progress Notes (Signed)
Daily Session Note  Patient Details  Name: Brian Waters MRN: [id number] Date of Birth: 1961-04-30 Referring Provider:    Encounter Date: 04/12/2016  Check In:     Session Check In - 04/12/16 0855    Check-In   Location [facility]   Staff Present Alberteen Sam, MA, ACSM RCEP, Exercise Physiologist;Susanne Bice, RN, BSN, Laveda Norman, BS, ACSM CEP, Exercise Physiologist   Supervising physician immediately available to respond to emergencies See telemetry face sheet for immediately available ER MD   Medication changes reported     No   Fall or balance concerns reported    No   Warm-up and Cool-down Performed on first and last piece of equipment   Resistance Training Performed No   VAD Patient? No   Pain Assessment   Currently in Pain? No/denies   Multiple Pain Sites No         Goals Met:  Independence with exercise equipment Exercise tolerated well No report of cardiac concerns or symptoms  Goals Unmet:  Not Applicable  Comments: Pt able to follow exercise prescription today without complaint.  Will continue to monitor for progression.     Dr. Emily Filbert is Medical Director for [place] and [name].

## 2016-04-14 DIAGNOSIS — I214 Non-ST elevation (NSTEMI) myocardial infarction: Secondary | ICD-10-CM

## 2016-04-14 DIAGNOSIS — Z9861 Coronary angioplasty status: Secondary | ICD-10-CM

## 2016-04-14 NOTE — Progress Notes (Signed)
Daily Session Note  Patient Details  Name: Brian Waters MRN: [id number] Date of Birth: 06-13-1961 Referring Provider:    Encounter Date: 04/14/2016  Check In:     Session Check In - 04/14/16 0858    Check-In   Location [facility]   Staff Present Gerlene Burdock, RN, Levie Heritage, MA, ACSM RCEP, Exercise Physiologist;Coron Rossano Oletta Darter, BA, ACSM CEP, Exercise Physiologist   Supervising physician immediately available to respond to emergencies See telemetry face sheet for immediately available ER MD   Medication changes reported     No   Fall or balance concerns reported    No   Warm-up and Cool-down Performed on first and last piece of equipment   Resistance Training Performed Yes   VAD Patient? No   Pain Assessment   Currently in Pain? No/denies   Multiple Pain Sites No         Goals Met:  Independence with exercise equipment Exercise tolerated well No report of cardiac concerns or symptoms Strength training completed today  Goals Unmet:  Not Applicable  Comments: Pt able to follow exercise prescription today without complaint.  Will continue to monitor for progression.    Dr. Emily Filbert is Medical Director for [place] and [name].

## 2016-04-16 ENCOUNTER — Encounter: Payer: Federal, State, Local not specified - PPO | Admitting: *Deleted

## 2016-04-16 DIAGNOSIS — I214 Non-ST elevation (NSTEMI) myocardial infarction: Secondary | ICD-10-CM

## 2016-04-16 NOTE — Progress Notes (Signed)
Daily Session Note  Patient Details  Name: Brian Waters MRN: [id number] Date of Birth: [birth year] Referring Provider:    Encounter Date: 04/16/2016  Check In:     Session Check In - 04/16/16 0846    Check-In   Location [facility]   Staff Present Heath Lark, RN, BSN, CCRP;Evett Kassa, RN, Levie Heritage, MA, ACSM RCEP, Exercise Physiologist   Supervising physician immediately available to respond to emergencies See telemetry face sheet for immediately available ER MD   Medication changes reported     No   Fall or balance concerns reported    No   Warm-up and Cool-down Performed on first and last piece of equipment   Resistance Training Performed Yes   VAD Patient? No   Pain Assessment   Currently in Pain? No/denies         Goals Met:  Proper associated with RPD/PD & O2 Sat Exercise tolerated well  Goals Unmet:  Not Applicable  Comments:     Dr. Emily Filbert is Medical Director for [name] and [name].

## 2016-04-19 ENCOUNTER — Encounter: Payer: Federal, State, Local not specified - PPO | Admitting: *Deleted

## 2016-04-19 DIAGNOSIS — I214 Non-ST elevation (NSTEMI) myocardial infarction: Secondary | ICD-10-CM

## 2016-04-19 DIAGNOSIS — Z9861 Coronary angioplasty status: Secondary | ICD-10-CM

## 2016-04-19 NOTE — Progress Notes (Signed)
Daily Session Note  Patient Details  Name: Brian Waters MRN: [id number] Date of Birth: 1961-06-27 Referring Provider:    Encounter Date: 04/19/2016  Check In:     Session Check In - 04/19/16 0902    Check-In   Location [facility]   Staff Present Alberteen Sam, MA, ACSM RCEP, Exercise Physiologist;Susanne Bice, RN, BSN, Laveda Norman, BS, ACSM CEP, Exercise Physiologist;Other   Supervising physician immediately available to respond to emergencies See telemetry face sheet for immediately available ER MD   Medication changes reported     No   Fall or balance concerns reported    No   Warm-up and Cool-down Performed on first and last piece of equipment   Resistance Training Performed No   VAD Patient? No   Pain Assessment   Currently in Pain? No/denies   Multiple Pain Sites No           Exercise Prescription Changes - 04/19/16 0900    Exercise Review   Progression Yes   Response to Exercise   Symptoms none   Comments Home Exercise Givien 04/19/16   Duration Progress to 30 minutes of continuous aerobic without signs/symptoms of physical distress   Intensity THRR New  112-148   Progression   Progression Continue to progress workloads to maintain intensity without signs/symptoms of physical distress.   Average METs 4.23  treadmill   Resistance Training   Training Prescription Yes   Weight 4-5 lbs  4 left hand, 5 right hand   Reps 10-15   Interval Training   Interval Training Yes   Equipment Treadmill;Elliptical   Comments 2 min 30 sec   Treadmill   MPH 3.2   Grade 2   Minutes 15   Bike   Level 1.6   Minutes 10   Elliptical   Level 2   Speed 4.5   Minutes 15   REL-XR   Level 7   Watts 99   Minutes 15   Home Exercise Plan   Plans to continue exercise at Home  walking and elliptical   Frequency Add 3 additional days to program exercise sessions.      Goals Met:  Independence with exercise equipment Exercise tolerated  well Personal goals reviewed No report of cardiac concerns or symptoms Reviewed home exercise guidelines (see exercise comments)  Goals Unmet:  Not Applicable  Comments: Pt able to follow exercise prescription today without complaint.  Will continue to monitor for progression.    Dr. Emily Filbert is Medical Director for [place] and [name].

## 2016-04-21 DIAGNOSIS — Z9861 Coronary angioplasty status: Secondary | ICD-10-CM

## 2016-04-21 DIAGNOSIS — I214 Non-ST elevation (NSTEMI) myocardial infarction: Secondary | ICD-10-CM

## 2016-04-21 NOTE — Progress Notes (Signed)
Daily Session Note  Patient Details  Name: Brian Waters MRN: [id number] Date of Birth: [birth year] Referring Provider:    Encounter Date: 04/21/2016  Check In:     Session Check In - 04/21/16 0830    Check-In   Location [facility]   Staff Present Gerlene Burdock, RN, BSN;Jessica Luan Pulling, MA, ACSM RCEP, Exercise Physiologist;Skyann Ganim Oletta Darter, BA, ACSM CEP, Exercise Physiologist   Supervising physician immediately available to respond to emergencies See telemetry face sheet for immediately available ER MD   Medication changes reported     No   Fall or balance concerns reported    No   Warm-up and Cool-down Performed on first and last piece of equipment   Resistance Training Performed Yes   VAD Patient? No   Pain Assessment   Currently in Pain? No/denies   Multiple Pain Sites No         Goals Met:  Independence with exercise equipment Exercise tolerated well No report of cardiac concerns or symptoms Strength training completed today Completed post 6 min walk today  Goals Unmet:  Not Applicable  Comments: Pt able to follow exercise prescription today without complaint.  Will continue to monitor for progression.      6 Minute Walk      03/01/16 1523 04/21/16 0951     6 Minute Walk   Phase Initial Discharge    Distance 1970 feet 2165 feet    Distance % Change  9.9 %    Walk Time 6 minutes 6 minutes    # of Rest Breaks  0    MPH 3.7 4.1    METS  5.98    RPE 13 15    VO2 Peak  20.91    Symptoms  No    Resting HR 69 bpm 76 bpm    Resting BP 126/64 mmHg 126/78 mmHg    Max Ex. HR 112 bpm 146 bpm    Max Ex. BP 140/76 mmHg 148/70 mmHg         Dr. Emily Filbert is Medical Director for [place] and [name].

## 2016-04-23 ENCOUNTER — Encounter: Payer: Federal, State, Local not specified - PPO | Admitting: *Deleted

## 2016-04-23 DIAGNOSIS — I214 Non-ST elevation (NSTEMI) myocardial infarction: Secondary | ICD-10-CM | POA: Diagnosis not present

## 2016-04-23 NOTE — Progress Notes (Signed)
Daily Session Note  Patient Details  Name: Brian Waters MRN: [id number] Date of Birth: September 07, 1961 Referring Provider:    Encounter Date: 04/23/2016  Check In:     Session Check In - 04/23/16 0857    Check-In   Location [facility]   Staff Present Gerlene Burdock, RN, BSN;Susanne Bice, RN, BSN, Gordy Councilman, MA, ACSM RCEP, Exercise Physiologist   Supervising physician immediately available to respond to emergencies See telemetry face sheet for immediately available ER MD   Medication changes reported     No   Fall or balance concerns reported    No   Warm-up and Cool-down Performed on first and last piece of equipment   Resistance Training Performed Yes   VAD Patient? No   Pain Assessment   Currently in Pain? No/denies         Goals Met:  Proper associated with RPD/PD & O2 Sat Exercise tolerated well  Goals Unmet:  Not Applicable  Comments:     Dr. Emily Filbert is Medical Director for [place] and [name].

## 2016-04-26 ENCOUNTER — Encounter: Payer: Federal, State, Local not specified - PPO | Admitting: *Deleted

## 2016-04-26 DIAGNOSIS — Z9861 Coronary angioplasty status: Secondary | ICD-10-CM

## 2016-04-26 DIAGNOSIS — I214 Non-ST elevation (NSTEMI) myocardial infarction: Secondary | ICD-10-CM

## 2016-04-26 NOTE — Progress Notes (Signed)
Daily Session Note  Patient Details  Name: Brian Waters MRN: [id number] Date of Birth: [birth year] Referring Provider:    Encounter Date: 04/26/2016  Check In:     Session Check In - 04/26/16 0813    Check-In   Location [facility]   Staff Present Alberteen Sam, MA, ACSM RCEP, Exercise Physiologist;Susanne Bice, RN, BSN, Laveda Norman, BS, ACSM CEP, Exercise Physiologist   Supervising physician immediately available to respond to emergencies See telemetry face sheet for immediately available ER MD   Medication changes reported     No   Fall or balance concerns reported    No   Warm-up and Cool-down Performed on first and last piece of equipment   Resistance Training Performed Yes   VAD Patient? No   Pain Assessment   Currently in Pain? No/denies   Multiple Pain Sites No         Goals Met:  Independence with exercise equipment Exercise tolerated well No report of cardiac concerns or symptoms Strength training completed today  Goals Unmet:  Not Applicable  Comments: Pt able to follow exercise prescription today without complaint.  Will continue to monitor for progression.    Dr. Emily Filbert is Medical Director for [place] and [name].

## 2016-04-28 ENCOUNTER — Encounter: Payer: Federal, State, Local not specified - PPO | Admitting: *Deleted

## 2016-04-28 ENCOUNTER — Encounter: Payer: Self-pay | Admitting: *Deleted

## 2016-04-28 DIAGNOSIS — I214 Non-ST elevation (NSTEMI) myocardial infarction: Secondary | ICD-10-CM

## 2016-04-28 DIAGNOSIS — Z9861 Coronary angioplasty status: Secondary | ICD-10-CM

## 2016-04-28 NOTE — Patient Instructions (Signed)
Discharge Instructions  Patient Details  Name: Brian Waters MRN: [id number] Date of Birth: 02/13/1961 Referring Provider:  Iran Waters, Brian A, MD   Number of Visits:   Reason for Discharge:  Patient reached a stable level of exercise. Patient independent in their exercise.  Smoking History:  History  Smoking status  . Never Smoker   Smokeless tobacco  . Not on file    Diagnosis:  NSTEMI (non-ST elevated myocardial infarction) (HCC)  S/P PTCA (percutaneous transluminal coronary angioplasty)  Initial Exercise Prescription:     Initial Exercise Prescription - 04/28/16 0800    Date of Initial Exercise RX and Referring Provider   Referring Provider Brian Waters, [name]  MD      Discharge Exercise Prescription (Final Exercise Prescription Changes):     Exercise Prescription Changes - 04/21/16 1100    Exercise Review   Progression Yes   Response to Exercise   Blood Pressure (Admit) 126/78 mmHg   Blood Pressure (Exercise) 148/70 mmHg   Blood Pressure (Exit) 122/60 mmHg   Heart Rate (Admit) 76 bpm   Heart Rate (Exercise) 150 bpm   Heart Rate (Exit) 95 bpm   Rating of Perceived Exertion (Exercise) 14   Symptoms none   Comments Home Exercise Givien 04/19/16   Duration Progress to 45 minutes of aerobic exercise without signs/symptoms of physical distress   Intensity THRR unchanged   Progression   Progression Continue to progress workloads to maintain intensity without signs/symptoms of physical distress.   Average METs 6.8   Resistance Training   Training Prescription Yes   Weight 4-5 lbs   Reps 10-15   Interval Training   Interval Training Yes   Equipment Treadmill;Elliptical   Comments 2 min 30 sec   Treadmill   MPH 3.3   Grade 8   Minutes 15   Elliptical   Level 9   Speed 4.8   Minutes 15   Home Exercise Plan   Plans to continue exercise at Home  walking and elliptical   Frequency Add 3 additional days to program exercise sessions.      Functional  Capacity:     6 Minute Walk      03/01/16 1523 03/01/16 1536 04/21/16 0951   6 Minute Walk   Phase Initial  Discharge   Distance 1970 feet 1970 feet 2165 feet   Distance % Change   9.9 %   Walk Time 6 minutes  6 minutes   # of Rest Breaks   0   MPH 3.7  4.1   METS  35.14 5.98   RPE 13 13 15    VO2 Peak   20.91   Symptoms   No   Resting HR 69 bpm 69 bpm 76 bpm   Resting BP 126/64 mmHg 126/64 mmHg 126/78 mmHg   Max Ex. HR 112 bpm 112 bpm 146 bpm   Max Ex. BP 140/76 mmHg 140/76 mmHg 148/70 mmHg      Quality of Life:     Quality of Life - 04/28/16 1012    Quality of Life Scores   Health/Function Post 21.83 %   Socioeconomic Post 22 %   Psych/Spiritual Post 20.07 %   Family Post 27.6 %   GLOBAL Post 22.35 %      Personal Goals: Goals established at orientation with interventions provided to work toward goal.     Personal Goals and Risk Factors at Admission - 03/01/16 1435    Core Components/Risk Factors/Patient Goals on Admission   Sedentary Yes  Intervention Provide advice, education, support and counseling about physical activity/exercise needs.;Develop an individualized exercise prescription for aerobic and resistive training based on initial evaluation findings, risk stratification, comorbidities and participant's personal goals.   Expected Outcomes Achievement of increased cardiorespiratory fitness and enhanced flexibility, muscular endurance and strength shown through measurements of functional capacity and personal statement of participant.   Stress Yes  Stress more work related.  Brian Waters has talked to his boss about making changes to reduce the work stress.   Intervention Offer individual and/or small group education and counseling on adjustment to heart disease, stress management and health-related lifestyle change. Teach and support self-help strategies.;Refer participants experiencing significant psychosocial distress to appropriate mental health specialists for  further evaluation and treatment. When possible, include family members and significant others in education/counseling sessions.   Expected Outcomes Short Term: Participant demonstrates changes in health-related behavior, relaxation and other stress management skills, ability to obtain effective social support, and compliance with psychotropic medications if prescribed.;Long Term: Emotional wellbeing is indicated by absence of clinically significant psychosocial distress or social isolation.       Personal Goals Discharge:     Goals and Risk Factor Review - 04/23/16 0802    Core Components/Risk Factors/Patient Goals Review   Personal Goals Review Sedentary;Stress;Lipids   Review Brian Waters has been doing great with exercise here and at home.  He is using an ellilptical at home and walking with dog.  He is also planning to join gym after graduation.  He has an appointment next week to check new lipids.  His stress levels have significantly decreased as he is now saying no to things at work.  (see pyschosocial note)    Expected Outcomes We will continue to work with Brian Waters to see continued improvements.      Nutrition & Weight - Outcomes:     Pre Biometrics - 03/01/16 1524    Pre Biometrics   Height  (1.778 m)   Weight 195 lb 1.6 oz (88.497 kg)   Waist Circumference 40.25 inches   Hip Circumference 43.5 inches   Waist to Hip Ratio 0.93 %   BMI (Calculated) 28.1       Nutrition:     Nutrition Therapy & Goals - 04/02/16 1056    Nutrition Therapy   Diet Instructed on Heart Healthy dietary guidelines including DASH diet principles.   Drug/Food Interactions Statins/Certain Fruits   Protein (specify units) 8   Fiber 30 grams   Whole Grain Foods 3 servings   Saturated Fats 13 max. grams  trans fat-0   Fruits and Vegetables 5 servings/day   Sodium 1500 grams   Personal Nutrition Goals   Personal Goal #1 Read labels for saturated fat, trans fat and sodium.   Personal Goal #2 Include  at least 3 servings of whole grains daily.   Intervention Plan   Intervention Prescribe, educate and counsel regarding individualized specific dietary modifications aiming towards targeted core components such as weight, hypertension, lipid management, diabetes, heart failure and other comorbidities.;Nutrition handout(s) given to patient.   Expected Outcomes Short Term Goal: Understand basic principles of dietary content, such as calories, fat, sodium, cholesterol and nutrients.;Short Term Goal: A plan has been developed with personal nutrition goals set during dietitian appointment.;Long Term Goal: Adherence to prescribed nutrition plan.      Nutrition Discharge:     Nutrition Assessments - 04/28/16 1015    Rate Your Plate Scores   Post Score 84   Post Score % 93 %  Education Questionnaire Score:     Knowledge Questionnaire Score - 04/28/16 1016    Knowledge Questionnaire Score   Post Score 26      Goals reviewed with patient; copy given to patient.

## 2016-04-28 NOTE — Progress Notes (Signed)
Daily Session Note  Patient Details  Name: Brian Waters MRN: [id number] Date of Birth: [birth year] Referring Provider:            Cardiac Rehab from 04/28/2016 in [facility]   Referring Provider  Kathlyn Sacramento  MD      Encounter Date: 04/28/2016  Check In:     Session Check In - 04/28/16 0759    Check-In   Location [facility]   Staff Present Alberteen Sam, MA, ACSM RCEP, Exercise Physiologist;Amanda Oletta Darter, BA, ACSM CEP, Exercise Physiologist;Carroll Enterkin, RN, BSN   Supervising physician immediately available to respond to emergencies See telemetry face sheet for immediately available ER MD   Medication changes reported     No   Fall or balance concerns reported    No   Warm-up and Cool-down Performed on first and last piece of equipment   Resistance Training Performed Yes   VAD Patient? No   Pain Assessment   Currently in Pain? No/denies   Multiple Pain Sites No         Goals Met:  Independence with exercise equipment Exercise tolerated well No report of cardiac concerns or symptoms Strength training completed today  Goals Unmet:  Not Applicable  Comments: Pt able to follow exercise prescription today without complaint.  Will continue to monitor for progression.    Dr. Emily Filbert is Medical Director for [place] and [name].

## 2016-04-28 NOTE — Progress Notes (Addendum)
Cardiac Individual Treatment Plan  Patient Details  Name: Brian Waters MRN: [id number] Date of Birth: 1961/05/30 Referring Provider:        Cardiac Rehab from 04/28/2016 in [facility] Cardiac and Pulmonary Rehab   Referring Provider  Kathlyn Sacramento  MD      Initial Encounter Date:       Cardiac Rehab from 04/28/2016 in [facility] Cardiac and Pulmonary Rehab   Referring Provider  Kathlyn Sacramento  MD      Visit Diagnosis: NSTEMI (non-ST elevated myocardial infarction) ([place])  S/P PTCA (percutaneous transluminal coronary angioplasty)  Patient's Home Medications on Admission:  Current outpatient prescriptions:  .  aspirin EC 81 MG tablet, Take 1 tablet (81 mg total) by mouth daily., Disp: 60 tablet, Rfl:  .  atorvastatin (LIPITOR) 40 MG tablet, Take 1 tablet (40 mg total) by mouth daily at 6 PM., Disp: 60 tablet, Rfl: 1 .  fluticasone (FLONASE) 50 MCG/ACT nasal spray, Place 2 sprays into both nostrils daily. , Disp: , Rfl:  .  loratadine (CLARITIN) 10 MG tablet, Take 10 mg by mouth daily., Disp: , Rfl:  .  metoprolol tartrate (LOPRESSOR) 25 MG tablet, Take 0.5 tablets (12.5 mg total) by mouth 2 (two) times daily. (Patient not taking: Reported on 03/01/2016), Disp: 60 tablet, Rfl: 1 .  Multiple Vitamin (MULTIVITAMIN) tablet, Take 1 tablet by mouth daily., Disp: , Rfl:  .  naproxen sodium (ANAPROX) 220 MG tablet, Take 220 mg by mouth 2 (two) times daily., Disp: , Rfl:  .  ticagrelor (BRILINTA) 90 MG TABS tablet, Take 1 tablet (90 mg total) by mouth 2 (two) times daily., Disp: 60 tablet, Rfl: 1  Past Medical History: Past Medical History  Diagnosis Date  . Seasonal allergies     Tobacco Use: History  Smoking status  . Never Smoker   Smokeless tobacco  . Not on file    Labs: Recent Review Flowsheet Data    There is no flowsheet data to display.       Exercise Target Goals:    Exercise Program Goal: Individual exercise prescription set with THRR, safety & activity barriers.  Participant demonstrates ability to understand and report RPE using BORG scale, to self-measure pulse accurately, and to acknowledge the importance of the exercise prescription.  Exercise Prescription Goal: Starting with aerobic activity 30 plus minutes a day, 3 days per week for initial exercise prescription. Provide home exercise prescription and guidelines that participant acknowledges understanding prior to discharge.  Activity Barriers & Risk Stratification:     Activity Barriers & Cardiac Risk Stratification - 03/01/16 1441    Activity Barriers & Cardiac Risk Stratification   Cardiac Risk Stratification Moderate      6 Minute Walk:     6 Minute Walk      03/01/16 1523 03/01/16 1536 04/21/16 0951   6 Minute Walk   Phase Initial  Discharge   Distance 1970 feet 1970 feet 2165 feet   Distance % Change   9.9 %   Walk Time 6 minutes  6 minutes   # of Rest Breaks   0   MPH 3.7  4.1   METS  35.14 5.98   RPE _0 VO2 Peak   20.91   Symptoms   No   Resting HR 69 bpm 69 bpm 76 bpm   Resting BP 126/64 mmHg 126/64 mmHg 126/78 mmHg   Max Ex. HR 112 bpm 112 bpm 146 bpm   Max Ex. BP 140/76 mmHg  140/76 mmHg 148/70 mmHg      Initial Exercise Prescription:     Initial Exercise Prescription - 04/28/16 0800    Date of Initial Exercise RX and Referring Provider   Referring Provider Kathlyn Sacramento  MD      Perform Capillary Blood Glucose checks as needed.  Exercise Prescription Changes:     Exercise Prescription Changes      03/01/16 1500 03/17/16 0700 04/07/16 1200 04/19/16 0900 04/21/16 1100   Exercise Review   Progression  Yes Yes Yes Yes   Response to Exercise   Blood Pressure (Admit) 126/64 mmHg 138/78 mmHg 120/80 mmHg  126/78 mmHg   Blood Pressure (Exercise) 140/76 mmHg 152/70 mmHg 158/72 mmHg  148/70 mmHg   Blood Pressure (Exit) 126/84 mmHg 132/82 mmHg 120/76 mmHg  122/60 mmHg   Heart Rate (Admit) 76 bpm 85 bpm 82 bpm  76 bpm   Heart Rate (Exercise) 113 bpm  145 bpm  Exercise HR ranged from 99 (REL)-145(AD) 139 bpm  150 bpm   Heart Rate (Exit) 75 bpm 92 bpm 91 bpm  95 bpm   Rating of Perceived Exertion (Exercise)   15  14   Symptoms  none none none none   Comments    Home Exercise Givien 04/19/16 Home Exercise Givien 04/19/16   Duration  Progress to 45 minutes of aerobic exercise without signs/symptoms of physical distress Progress to 30 minutes of continuous aerobic without signs/symptoms of physical distress Progress to 30 minutes of continuous aerobic without signs/symptoms of physical distress Progress to 45 minutes of aerobic exercise without signs/symptoms of physical distress   Intensity  Rest + 40 THRR New  112-148 THRR New  112-148 THRR unchanged   Progression   Progression  Continue progressive overload as per policy without signs/symptoms or physical distress. Continue to progress workloads to maintain intensity without signs/symptoms of physical distress. Continue to progress workloads to maintain intensity without signs/symptoms of physical distress. Continue to progress workloads to maintain intensity without signs/symptoms of physical distress.   Average METs   4.23  treadmill 4.23  treadmill 6.8   Resistance Training   Training Prescription  Yes Yes Yes Yes   Weight  3 4-5 lbs  4 left hand, 5 right hand 4-5 lbs  4 left hand, 5 right hand 4-5 lbs   Reps  10-15  Pt recently had left shoulder surgery.   10-15 10-15 10-15   Interval Training   Interval Training   No Yes Yes   Equipment    Treadmill;Elliptical Treadmill;Elliptical   Comments    2 min 30 sec 2 min 30 sec   Treadmill   MPH  3 3.2 3.2 3.3   Grade  _0 Minutes  _1 Bike   Level  1.6  AD bike  1.6 1.6    Minutes  _2 Elliptical   Level   _3 Speed   4.5 4.5 4.8   Minutes   _4 REL-XR   Level  _5 Watts  80 99 99    Minutes  _6 Home Exercise Plan   Plans to continue exercise at    Home  walking and elliptical  Home  walking and elliptical   Frequency    Add 3 additional days to program exercise sessions. Add 3 additional days to program exercise sessions.  Exercise Comments:     Exercise Comments      03/03/16 0843 03/17/16 0718 03/17/16 0719 04/07/16 1241 04/19/16 0904   Exercise Comments First day of exercise!Marden Noble was oriented to the gym and the equipment functions and settings. Procedures and policies of the gym were outlined and explained. The patient's individual exercise prescription and treatment plan were reviewed with him. All starting workloads were established based on the results of the functional testing  done at the initial intake visit. The plan for exercise progression was also introduced and progression will be customized based on the patient's performance and goals.  Reviewed individualized exercise prescription and made increases per departmental policy. Exercise increases were discussed with the patient and they were able to perform the new work loads without issue (no signs or symptoms).  Marden Noble has gotten off to a great start with his exercise program. He has tolerated his exercise well and has shown increases in his strengh and stamina by increasing his intensities and duration of exercise. His THRR was increased to rest +40 to accomodate his ability to challenge himself with exercise.  Marden Noble has continued to make progress with exercise.  He is feeling more challenged during exercise.  We will discuss adding in interval training at next exercise session.  He enjoys the challenge on the ellipitcal. Reviewed home exercise with pt today.  Pt plans to walk with dog and use elliptical at home for exercise.  Reviewed THR, pulse, RPE, sign and symptoms, and when to call 911 or MD.  Also discussed weather considerations and indoor options.  Pt voiced understanding.     04/21/16 0953           Exercise Comments [name] completed his post 6 min walk test today.  He made a 9.9% improvement  (195 ft).  He is doing great with his exercise.  He is enjoyoing interval training.          Discharge Exercise Prescription (Final Exercise Prescription Changes):     Exercise Prescription Changes - 04/21/16 1100    Exercise Review   Progression Yes   Response to Exercise   Blood Pressure (Admit) 126/78 mmHg   Blood Pressure (Exercise) 148/70 mmHg   Blood Pressure (Exit) 122/60 mmHg   Heart Rate (Admit) 76 bpm   Heart Rate (Exercise) 150 bpm   Heart Rate (Exit) 95 bpm   Rating of Perceived Exertion (Exercise) 14   Symptoms none   Comments Home Exercise Givien 04/19/16   Duration Progress to 45 minutes of aerobic exercise without signs/symptoms of physical distress   Intensity THRR unchanged   Progression   Progression Continue to progress workloads to maintain intensity without signs/symptoms of physical distress.   Average METs 6.8   Resistance Training   Training Prescription Yes   Weight 4-5 lbs   Reps 10-15   Interval Training   Interval Training Yes   Equipment Treadmill;Elliptical   Comments 2 min 30 sec   Treadmill   MPH 3.3   Grade 8   Minutes 15   Elliptical   Level 9   Speed 4.8   Minutes 15   Home Exercise Plan   Plans to continue exercise at Home  walking and elliptical   Frequency Add 3 additional days to program exercise sessions.      Nutrition:  Target Goals: Understanding of nutrition guidelines, daily intake of sodium <1538m, cholesterol <2064m calories 30% from fat and 7% or less from saturated fats, daily to have 5  or more servings of fruits and vegetables.  Biometrics:     Pre Biometrics - 03/01/16 1524    Pre Biometrics   Height 5' 10" (1.778 m)   Weight 195 lb 1.6 oz (88.497 kg)   Waist Circumference 40.25 inches   Hip Circumference 43.5 inches   Waist to Hip Ratio 0.93 %   BMI (Calculated) 28.1       Nutrition Therapy Plan and Nutrition Goals:     Nutrition Therapy & Goals - 04/02/16 1056    Nutrition Therapy   Diet  Instructed on Heart Healthy dietary guidelines including DASH diet principles.   Drug/Food Interactions Statins/Certain Fruits   Protein (specify units) 8   Fiber 30 grams   Whole Grain Foods 3 servings   Saturated Fats 13 max. grams  trans fat-0   Fruits and Vegetables 5 servings/day   Sodium 1500 grams   Personal Nutrition Goals   Personal Goal #1 Read labels for saturated fat, trans fat and sodium.   Personal Goal #2 Include at least 3 servings of whole grains daily.   Intervention Plan   Intervention Prescribe, educate and counsel regarding individualized specific dietary modifications aiming towards targeted core components such as weight, hypertension, lipid management, diabetes, heart failure and other comorbidities.;Nutrition handout(s) given to patient.   Expected Outcomes Short Term Goal: Understand basic principles of dietary content, such as calories, fat, sodium, cholesterol and nutrients.;Short Term Goal: A plan has been developed with personal nutrition goals set during dietitian appointment.;Long Term Goal: Adherence to prescribed nutrition plan.      Nutrition Discharge: Rate Your Plate Scores:     Nutrition Assessments - 04/28/16 1015    Rate Your Plate Scores   Post Score 84   Post Score % 93 %      Nutrition Goals Re-Evaluation:   Psychosocial: Target Goals: Acknowledge presence or absence of depression, maximize coping skills, provide positive support system. Participant is able to verbalize types and ability to use techniques and skills needed for reducing stress and depression.  Initial Review & Psychosocial Screening:     Initial Psych Review & Screening - 03/01/16 1437    Initial Review   Current issues with Current Stress Concerns  Stress issiues with work and family. Overwhelmed with current medical diagnosis.  Has stated  he took time off this week and that time away form work has helped reduce the stress feelings.    Source of Stress Concerns  Occupation   Comments One child in college and one in high school.   Another graduated from college.    Family Dynamics   Good Support System? Yes  Wife and children   Barriers   Psychosocial barriers to participate in program There are no identifiable barriers or psychosocial needs.;The patient should benefit from training in stress management and relaxation.   Screening Interventions   Interventions Encouraged to exercise      Quality of Life Scores:     Quality of Life - 04/28/16 1012    Quality of Life Scores   Health/Function Post 21.83 %   Socioeconomic Post 22 %   Psych/Spiritual Post 20.07 %   Family Post 27.6 %   GLOBAL Post 22.35 %      PHQ-9:     Recent Review Flowsheet Data    Depression screen [facility] 2/9 04/28/2016 03/01/2016   Decreased Interest - 1   Down, Depressed, Hopeless 1 1   PHQ - 2 Score 1 2   Altered sleeping 1  1   Tired, decreased energy 1 1   Change in appetite 0 1   Feeling bad or failure about yourself  0 1   Trouble concentrating 0 1   Moving slowly or fidgety/restless 0 1   Suicidal thoughts 0 0   PHQ-9 Score 3 8   Difficult doing work/chores Not difficult at all Somewhat difficult       Psychosocial Evaluation and Intervention:     Psychosocial Evaluation - 03/03/16 1005    Psychosocial Evaluation & Interventions   Interventions Stress management education;Relaxation education;Encouraged to exercise with the program and follow exercise prescription   Comments Counselor met with Mr. Goheen today for initial psychosocial evaluation.  He is a 55 year old who has a strong family history of heart disease and recently had an incident himself.  He has a strong support system with a spouse of 63 years and several adult children.  He is also actively involved in his local church community.  Mr. Bunte had shoulder surgery in January so now he is recovering from this as well.  He denies sleeping well (maybe interrupted 5-6 hours per night) and  denies a history of depression.  Although he admits to some anxiety symptoms currently with job stress in particular contributing to this.  He states his mood is generally positive.  Mr. Schaumburg has goals to be educated on his limitations of exercise and to increase his stamina and strength while in this program.  Counselor made some recommendations for stress management with deep breathing practiced and consistent exercise.  Counselor also suggested checking with his doctor or pharmacist to see if an OTC sleep aid might be helpful during this time.     Continued Psychosocial Services Needed Yes  Mr. Enyeart is recommended to practice deep breathing and consistent exercise to help with stress management.  In addition, he will benefit from that psychoeducational component of this program.  Counselor will follow with him re: these suggestions.       Psychosocial Re-Evaluation:     Psychosocial Re-Evaluation      04/21/16 0933 04/26/16 0958         Psychosocial Re-Evaluation   Comments Counselor follow up with Mr. Cafiero today reporting feeling stronger now as he heads towards completion of this program.  He reports he is sleeping better and more consistently and he is feeling more stamina and strength since beginning this program.  Mr. Wlodarczyk also is practicing better stress management skills as he reports setting better limits at work; delegating tasks; and learning to say "no" more often to others - especially at church.  Counselor commended Mr. Weirauch for his hard work and positive outcomes so far.  He will be finishing up soon and  plans to consistently work out on his elliptical at home to maintain this progress.    Counselor follow up with Mr. Fails today as he reported struggling with relaxation techniques and stress from his job interfering with this process.  Counselor shared some mindfulness techniques with Mr. Delane Ginger and encouraged him to practice.  By the end of class, he reported trying  this with some success already experienced.  Counselor will follow with him re: this in the future.           Vocational Rehabilitation: Provide vocational rehab assistance to qualifying candidates.   Vocational Rehab Evaluation & Intervention:     Vocational Rehab - 03/01/16 1401    Initial Vocational Rehab Evaluation & Intervention   Assessment shows  need for Vocational Rehabilitation No      Education: Education Goals: Education classes will be provided on a weekly basis, covering required topics. Participant will state understanding/return demonstration of topics presented.  Learning Barriers/Preferences:     Learning Barriers/Preferences - 03/01/16 1441    Learning Barriers/Preferences   Learning Barriers None   Learning Preferences Computer/Internet;Individual Instruction;Video      Education Topics: General Nutrition Guidelines/Fats and Fiber: -Group instruction provided by verbal, written material, models and posters to present the general guidelines for heart healthy nutrition. Gives an explanation and review of dietary fats and fiber.          Cardiac Rehab from 04/28/2016 in [facility] Cardiac and Pulmonary Rehab   Date  04/12/16   Educator  Jaclyn Shaggy   Instruction Review Code  2- meets goals/outcomes      Controlling Sodium/Reading Food Labels: -Group verbal and written material supporting the discussion of sodium use in heart healthy nutrition. Review and explanation with models, verbal and written materials for utilization of the food label.      Cardiac Rehab from 04/28/2016 in [facility] Cardiac and Pulmonary Rehab   Date  04/19/16   Educator  CR   Instruction Review Code  2- meets goals/outcomes      Exercise Physiology & Risk Factors: - Group verbal and written instruction with models to review the exercise physiology of the cardiovascular system and associated critical values. Details cardiovascular disease risk factors and the goals associated with each risk  factor.      Cardiac Rehab from 04/28/2016 in [facility] Cardiac and Pulmonary Rehab   Date  04/21/16   Educator  [facility]   Instruction Review Code  2- meets goals/outcomes      Aerobic Exercise & Resistance Training: - Gives group verbal and written discussion on the health impact of inactivity. On the components of aerobic and resistive training programs and the benefits of this training and how to safely progress through these programs.      Cardiac Rehab from 04/28/2016 in [facility] Cardiac and Pulmonary Rehab   Date  04/26/16   Educator  [facility]   Instruction Review Code  2- meets goals/outcomes      Flexibility, Balance, General Exercise Guidelines: - Provides group verbal and written instruction on the benefits of flexibility and balance training programs. Provides general exercise guidelines with specific guidelines to those with heart or lung disease. Demonstration and skill practice provided.      Cardiac Rehab from 04/28/2016 in [facility] Cardiac and Pulmonary Rehab   Date  04/28/16   Educator  AS   Instruction Review Code  R- Review/reinforce [Second Class]      Stress Management: - Provides group verbal and written instruction about the health risks of elevated stress, cause of high stress, and healthy ways to reduce stress.      Cardiac Rehab from 04/28/2016 in [facility] Cardiac and Pulmonary Rehab   Date  03/10/16   Educator  [facility]   Instruction Review Code  2- meets goals/outcomes      Depression: - Provides group verbal and written instruction on the correlation between heart/lung disease and depressed mood, treatment options, and the stigmas associated with seeking treatment.      Cardiac Rehab from 04/28/2016 in [facility] Cardiac and Pulmonary Rehab   Date  04/07/16   Educator  [facility]   Instruction Review Code  2- meets goals/outcomes      Anatomy & Physiology of the Heart: - Group verbal and written instruction and  models provide basic cardiac anatomy and physiology, with the coronary electrical and  arterial systems. Review of: AMI, Angina, Valve disease, Heart Failure, Cardiac Arrhythmia, Pacemakers, and the ICD.      Cardiac Rehab from 04/28/2016 in [facility] Cardiac and Pulmonary Rehab   Date  03/08/16   Educator  SB   Instruction Review Code  2- meets goals/outcomes      Cardiac Procedures: - Group verbal and written instruction and models to describe the testing methods done to diagnose heart disease. Reviews the outcomes of the test results. Describes the treatment choices: Medical Management, Angioplasty, or Coronary Bypass Surgery.      Cardiac Rehab from 04/28/2016 in [facility] Cardiac and Pulmonary Rehab   Date  03/15/16   Educator  CE   Instruction Review Code  2- meets goals/outcomes      Cardiac Medications: - Group verbal and written instruction to review commonly prescribed medications for heart disease. Reviews the medication, class of the drug, and side effects. Includes the steps to properly store meds and maintain the prescription regimen.   Go Sex-Intimacy & Heart Disease, Get SMART - Goal Setting: - Group verbal and written instruction through game format to discuss heart disease and the return to sexual intimacy. Provides group verbal and written material to discuss and apply goal setting through the application of the S.M.A.R.T. Method.      Cardiac Rehab from 04/28/2016 in [facility] Cardiac and Pulmonary Rehab   Date  03/15/16   Educator  CE   Instruction Review Code  2- meets goals/outcomes      Other Matters of the Heart: - Provides group verbal, written materials and models to describe Heart Failure, Angina, Valve Disease, and Diabetes in the realm of heart disease. Includes description of the disease process and treatment options available to the cardiac patient.      Cardiac Rehab from 04/28/2016 in [facility] Cardiac and Pulmonary Rehab   Date  03/08/16   Educator  SB   Instruction Review Code  2- meets goals/outcomes      Exercise & Equipment Safety: - Individual  verbal instruction and demonstration of equipment use and safety with use of the equipment.      Cardiac Rehab from 04/28/2016 in [facility] Cardiac and Pulmonary Rehab   Date  03/01/16   Educator  SB   Instruction Review Code  2- meets goals/outcomes      Infection Prevention: - Provides verbal and written material to individual with discussion of infection control including proper hand washing and proper equipment cleaning during exercise session.      Cardiac Rehab from 04/28/2016 in [facility] Cardiac and Pulmonary Rehab   Date  03/01/16   Educator  SB   Instruction Review Code  2- meets goals/outcomes      Falls Prevention: - Provides verbal and written material to individual with discussion of falls prevention and safety.      Cardiac Rehab from 04/28/2016 in [facility] Cardiac and Pulmonary Rehab   Date  03/01/16   Educator  SB   Instruction Review Code  2- meets goals/outcomes      Diabetes: - Individual verbal and written instruction to review signs/symptoms of diabetes, desired ranges of glucose level fasting, after meals and with exercise. Advice that pre and post exercise glucose checks will be done for 3 sessions at entry of program.    Knowledge Questionnaire Score:     Knowledge Questionnaire Score - 04/28/16 1016    Knowledge Questionnaire Score   Post  Score 26      Core Components/Risk Factors/Patient Goals at Admission:     Personal Goals and Risk Factors at Admission - 03/01/16 1435    Core Components/Risk Factors/Patient Goals on Admission   Sedentary Yes   Intervention Provide advice, education, support and counseling about physical activity/exercise needs.;Develop an individualized exercise prescription for aerobic and resistive training based on initial evaluation findings, risk stratification, comorbidities and participant's personal goals.   Expected Outcomes Achievement of increased cardiorespiratory fitness and enhanced flexibility, muscular endurance and strength  shown through measurements of functional capacity and personal statement of participant.   Stress Yes  Stress more work related.  Marden Noble has talked to his boss about making changes to reduce the work stress.   Intervention Offer individual and/or small group education and counseling on adjustment to heart disease, stress management and health-related lifestyle change. Teach and support self-help strategies.;Refer participants experiencing significant psychosocial distress to appropriate mental health specialists for further evaluation and treatment. When possible, include family members and significant others in education/counseling sessions.   Expected Outcomes Short Term: Participant demonstrates changes in health-related behavior, relaxation and other stress management skills, ability to obtain effective social support, and compliance with psychotropic medications if prescribed.;Long Term: Emotional wellbeing is indicated by absence of clinically significant psychosocial distress or social isolation.      Core Components/Risk Factors/Patient Goals Review:      Goals and Risk Factor Review      04/23/16 0802           Core Components/Risk Factors/Patient Goals Review   Personal Goals Review Sedentary;Stress;Lipids       Review Marden Noble has been doing great with exercise here and at home.  He is using an ellilptical at home and walking with dog.  He is also planning to join gym after graduation.  He has an appointment next week to check new lipids.  His stress levels have significantly decreased as he is now saying no to things at work.  (see pyschosocial note)        Expected Outcomes We will continue to work with Marden Noble to see continued improvements.          Core Components/Risk Factors/Patient Goals at Discharge (Final Review):      Goals and Risk Factor Review - 04/23/16 0802    Core Components/Risk Factors/Patient Goals Review   Personal Goals Review Sedentary;Stress;Lipids   Review Marden Noble  has been doing great with exercise here and at home.  He is using an ellilptical at home and walking with dog.  He is also planning to join gym after graduation.  He has an appointment next week to check new lipids.  His stress levels have significantly decreased as he is now saying no to things at work.  (see pyschosocial note)    Expected Outcomes We will continue to work with Marden Noble to see continued improvements.      ITP Comments:     ITP Comments      03/01/16 1359 03/03/16 0657 03/28/16 1252 04/02/16 0935 04/28/16 0732   ITP Comments inital med review completed.  ITP-continue with ITP 30 day review New to program  Continue with ITP 30 day review. Continue with ITP [name] felt dizzy after his heart rate was 140 on the Elliptical standing elliptical then he went to a seat piece of equipment with a heart rate of 90 so dizzy and given 2 glasses of water so dizziness was resolved.  30 day review.  Continue with ITP  Doing well with ellipitical, keeping hydrated during exercise.      Comments: Doing well.

## 2016-04-28 NOTE — Progress Notes (Signed)
Cardiac Individual Treatment Plan  Patient Details  Name: Brian Waters MRN: [id number] Date of Birth: 09/18/1961 Referring Provider:    Initial Encounter Date:       Cardiac Rehab from 03/01/2016 in [facility] Cardiac and Pulmonary Rehab   Date  03/01/16      Visit Diagnosis: NSTEMI (non-ST elevated myocardial infarction) (HCC)  S/P PTCA (percutaneous transluminal coronary angioplasty)  Patient's Home Medications on Admission:  Current outpatient prescriptions:  .  aspirin EC 81 MG tablet, Take 1 tablet (81 mg total) by mouth daily., Disp: 60 tablet, Rfl:  .  atorvastatin (LIPITOR) 40 MG tablet, Take 1 tablet (40 mg total) by mouth daily at 6 PM., Disp: 60 tablet, Rfl: 1 .  fluticasone (FLONASE) 50 MCG/ACT nasal spray, Place 2 sprays into both nostrils daily. , Disp: , Rfl:  .  loratadine (CLARITIN) 10 MG tablet, Take 10 mg by mouth daily., Disp: , Rfl:  .  metoprolol tartrate (LOPRESSOR) 25 MG tablet, Take 0.5 tablets (12.5 mg total) by mouth 2 (two) times daily. (Patient not taking: Reported on 03/01/2016), Disp: 60 tablet, Rfl: 1 .  Multiple Vitamin (MULTIVITAMIN) tablet, Take 1 tablet by mouth daily., Disp: , Rfl:  .  naproxen sodium (ANAPROX) 220 MG tablet, Take 220 mg by mouth 2 (two) times daily., Disp: , Rfl:  .  ticagrelor (BRILINTA) 90 MG TABS tablet, Take 1 tablet (90 mg total) by mouth 2 (two) times daily., Disp: 60 tablet, Rfl: 1  Past Medical History: Past Medical History  Diagnosis Date  . Seasonal allergies     Tobacco Use: History  Smoking status  . Never Smoker   Smokeless tobacco  . Not on file    Labs: Recent Review Flowsheet Data    There is no flowsheet data to display.       Exercise Target Goals:    Exercise Program Goal: Individual exercise prescription set with THRR, safety & activity barriers. Participant demonstrates ability to understand and report RPE using BORG scale, to self-measure pulse accurately, and to acknowledge the importance  of the exercise prescription.  Exercise Prescription Goal: Starting with aerobic activity 30 plus minutes a day, 3 days per week for initial exercise prescription. Provide home exercise prescription and guidelines that participant acknowledges understanding prior to discharge.  Activity Barriers & Risk Stratification:     Activity Barriers & Cardiac Risk Stratification - 03/01/16 1441    Activity Barriers & Cardiac Risk Stratification   Cardiac Risk Stratification Moderate      6 Minute Walk:     6 Minute Walk      03/01/16 1523 04/21/16 0951     6 Minute Walk   Phase Initial Discharge    Distance 1970 feet 2165 feet    Distance % Change  9.9 %    Walk Time 6 minutes 6 minutes    # of Rest Breaks  0    MPH 3.7 4.1    METS  5.98    RPE 13 15    VO2 Peak  20.91    Symptoms  No    Resting HR 69 bpm 76 bpm    Resting BP 126/64 mmHg 126/78 mmHg    Max Ex. HR 112 bpm 146 bpm    Max Ex. BP 140/76 mmHg 148/70 mmHg       Initial Exercise Prescription:     Initial Exercise Prescription - 03/01/16 1500    Date of Initial Exercise RX and Referring Provider   Date 03/01/16  Treadmill   MPH 3   Grade 0   Minutes 15   Bike   Level 1   Minutes 15   Recumbant Bike   Level 3   Watts 30   Minutes 15   NuStep   Level 3   Watts 50   Minutes 15   Cybex   Level 2   RPM 50   Minutes 15   Recumbant Elliptical   Level 2   Watts 20   Minutes 15   Elliptical   Level 1   Speed 3   Minutes 15   REL-XR   Level 2   Watts 60   Minutes 15   T5 Nustep   Level 2   Watts 50   Minutes 15   Biostep-RELP   Level 4   Watts 50   Minutes 15   Prescription Details   Frequency (times per week) 3   Duration Progress to 45 minutes of aerobic exercise without signs/symptoms of physical distress   Intensity   THRR REST +  30   Ratings of Perceived Exertion 11-13   Perceived Dyspnea 0-4   Progression   Progression Continue to progress workloads to maintain intensity without  signs/symptoms of physical distress.   Resistance Training   Training Prescription Yes   Weight 3   Reps 10-15  Pt recently had left shoulder surgery.        Perform Capillary Blood Glucose checks as needed.  Exercise Prescription Changes:     Exercise Prescription Changes      03/01/16 1500 03/17/16 0700 04/07/16 1200 04/19/16 0900 04/21/16 1100   Exercise Review   Progression  Yes Yes Yes Yes   Response to Exercise   Blood Pressure (Admit) 126/64 mmHg 138/78 mmHg 120/80 mmHg  126/78 mmHg   Blood Pressure (Exercise) 140/76 mmHg 152/70 mmHg 158/72 mmHg  148/70 mmHg   Blood Pressure (Exit) 126/84 mmHg 132/82 mmHg 120/76 mmHg  122/60 mmHg   Heart Rate (Admit) 76 bpm 85 bpm 82 bpm  76 bpm   Heart Rate (Exercise) 113 bpm 145 bpm  Exercise HR ranged from 99 (REL)-145(AD) 139 bpm  150 bpm   Heart Rate (Exit) 75 bpm 92 bpm 91 bpm  95 bpm   Rating of Perceived Exertion (Exercise)   15  14   Symptoms  none none none none   Comments    Home Exercise Givien 04/19/16 Home Exercise Givien 04/19/16   Duration  Progress to 45 minutes of aerobic exercise without signs/symptoms of physical distress Progress to 30 minutes of continuous aerobic without signs/symptoms of physical distress Progress to 30 minutes of continuous aerobic without signs/symptoms of physical distress Progress to 45 minutes of aerobic exercise without signs/symptoms of physical distress   Intensity  Rest + 40 THRR New  112-148 THRR New  112-148 THRR unchanged   Progression   Progression  Continue progressive overload as per policy without signs/symptoms or physical distress. Continue to progress workloads to maintain intensity without signs/symptoms of physical distress. Continue to progress workloads to maintain intensity without signs/symptoms of physical distress. Continue to progress workloads to maintain intensity without signs/symptoms of physical distress.   Average METs   4.23  treadmill 4.23  treadmill 6.8    Resistance Training   Training Prescription  Yes Yes Yes Yes   Weight  3 4-5 lbs  4 left hand, 5 right hand 4-5 lbs  4 left hand, 5 right hand 4-5 lbs   Reps  10-15  Pt recently had left shoulder surgery.   10-15 10-15 10-15   Interval Training   Interval Training   No Yes Yes   Equipment    Treadmill;Elliptical Treadmill;Elliptical   Comments    2 min 30 sec 2 min 30 sec   Treadmill   MPH  3 3.2 3.2 3.3   Grade  _0 Minutes  _1 Bike   Level  1.6  AD bike  1.6 1.6    Minutes  _2 Elliptical   Level   _3 Speed   4.5 4.5 4.8   Minutes   _4 REL-XR   Level  _5 Watts  80 99 99    Minutes  _6 Home Exercise Plan   Plans to continue exercise at    Home  walking and elliptical Home  walking and elliptical   Frequency    Add 3 additional days to program exercise sessions. Add 3 additional days to program exercise sessions.      Exercise Comments:     Exercise Comments      03/03/16 0843 03/17/16 0718 03/17/16 0719 04/07/16 1241 04/19/16 0904   Exercise Comments First day of exercise!Brian Waters was oriented to the gym and the equipment functions and settings. Procedures and policies of the gym were outlined and explained. The patient's individual exercise prescription and treatment plan were reviewed with him. All starting workloads were established based on the results of the functional testing  done at the initial intake visit. The plan for exercise progression was also introduced and progression will be customized based on the patient's performance and goals.  Reviewed individualized exercise prescription and made increases per departmental policy. Exercise increases were discussed with the patient and they were able to perform the new work loads without issue (no signs or symptoms).  Brian Waters has gotten off to a great start with his exercise program. He has tolerated his exercise well and has shown increases in his strengh and stamina by  increasing his intensities and duration of exercise. His THRR was increased to rest +40 to accomodate his ability to challenge himself with exercise.  Brian Waters has continued to make progress with exercise.  He is feeling more challenged during exercise.  We will discuss adding in interval training at next exercise session.  He enjoys the challenge on the ellipitcal. Reviewed home exercise with pt today.  Pt plans to walk with dog and use elliptical at home for exercise.  Reviewed THR, pulse, RPE, sign and symptoms, and when to call 911 or MD.  Also discussed weather considerations and indoor options.  Pt voiced understanding.     04/21/16 0953           Exercise Comments Brian Waters completed his post 6 min walk test today.  He made a 9.9% improvement (195 ft).  He is doing great with his exercise.  He is enjoyoing interval training.          Discharge Exercise Prescription (Final Exercise Prescription Changes):     Exercise Prescription Changes - 04/21/16 1100    Exercise Review   Progression Yes   Response to Exercise   Blood Pressure (Admit) 126/78 mmHg   Blood Pressure (Exercise) 148/70 mmHg   Blood Pressure (Exit) 122/60 mmHg   Heart Rate (Admit) 76 bpm   Heart Rate (Exercise)  150 bpm   Heart Rate (Exit) 95 bpm   Rating of Perceived Exertion (Exercise) 14   Symptoms none   Comments Home Exercise Givien 04/19/16   Duration Progress to 45 minutes of aerobic exercise without signs/symptoms of physical distress   Intensity THRR unchanged   Progression   Progression Continue to progress workloads to maintain intensity without signs/symptoms of physical distress.   Average METs 6.8   Resistance Training   Training Prescription Yes   Weight 4-5 lbs   Reps 10-15   Interval Training   Interval Training Yes   Equipment Treadmill;Elliptical   Comments 2 min 30 sec   Treadmill   MPH 3.3   Grade 8   Minutes 15   Elliptical   Level 9   Speed 4.8   Minutes 15   Home Exercise Plan   Plans to  continue exercise at Home  walking and elliptical   Frequency Add 3 additional days to program exercise sessions.      Nutrition:  Target Goals: Understanding of nutrition guidelines, daily intake of sodium <1570m, cholesterol <2033m calories 30% from fat and 7% or less from saturated fats, daily to have 5 or more servings of fruits and vegetables.  Biometrics:     Pre Biometrics - 03/01/16 1524    Pre Biometrics   Height _0  (1.778 m)   Weight 195 lb 1.6 oz (88.497 kg)   Waist Circumference 40.25 inches   Hip Circumference 43.5 inches   Waist to Hip Ratio 0.93 %   BMI (Calculated) 28.1       Nutrition Therapy Plan and Nutrition Goals:     Nutrition Therapy & Goals - 04/02/16 1056    Nutrition Therapy   Diet Instructed on Heart Healthy dietary guidelines including DASH diet principles.   Drug/Food Interactions Statins/Certain Fruits   Protein (specify units) 8   Fiber 30 grams   Whole Grain Foods 3 servings   Saturated Fats 13 max. grams  trans fat-0   Fruits and Vegetables 5 servings/day   Sodium 1500 grams   Personal Nutrition Goals   Personal Goal #1 Read labels for saturated fat, trans fat and sodium.   Personal Goal #2 Include at least 3 servings of whole grains daily.   Intervention Plan   Intervention Prescribe, educate and counsel regarding individualized specific dietary modifications aiming towards targeted core components such as weight, hypertension, lipid management, diabetes, heart failure and other comorbidities.;Nutrition handout(s) given to patient.   Expected Outcomes Short Term Goal: Understand basic principles of dietary content, such as calories, fat, sodium, cholesterol and nutrients.;Short Term Goal: A plan has been developed with personal nutrition goals set during dietitian appointment.;Long Term Goal: Adherence to prescribed nutrition plan.      Nutrition Discharge: Rate Your Plate Scores:     Nutrition Assessments - 04/02/16 1059     Rate Your Plate Scores   Pre Score 72   Pre Score % 80 %      Nutrition Goals Re-Evaluation:   Psychosocial: Target Goals: Acknowledge presence or absence of depression, maximize coping skills, provide positive support system. Participant is able to verbalize types and ability to use techniques and skills needed for reducing stress and depression.  Initial Review & Psychosocial Screening:     Initial Psych Review & Screening - 03/01/16 1437    Initial Review   Current issues with Current Stress Concerns  Stress issiues with work and family. Overwhelmed with current medical diagnosis.  Has stated  he  took time off this week and that time away form work has helped reduce the stress feelings.    Source of Stress Concerns Occupation   Comments One child in college and one in high school.   Another graduated from college.    Family Dynamics   Good Support System? Yes  Wife and children   Barriers   Psychosocial barriers to participate in program There are no identifiable barriers or psychosocial needs.;The patient should benefit from training in stress management and relaxation.   Screening Interventions   Interventions Encouraged to exercise      Quality of Life Scores:     Quality of Life - 03/01/16 1540    Quality of Life Scores   Health/Function Pre 19.57 %   Socioeconomic Pre 23.42 %   Psych/Spiritual Pre 21.07 %   Family Pre 27.1 %   GLOBAL Pre 21.73 %      PHQ-9:     Recent Review Flowsheet Data    Depression screen [facility] 2/9 03/01/2016   Decreased Interest 1   Down, Depressed, Hopeless 1   PHQ - 2 Score 2   Altered sleeping 1   Tired, decreased energy 1   Change in appetite 1   Feeling bad or failure about yourself  1   Trouble concentrating 1   Moving slowly or fidgety/restless 1   Suicidal thoughts 0   PHQ-9 Score 8   Difficult doing work/chores Somewhat difficult       Psychosocial Evaluation and Intervention:     Psychosocial Evaluation -  03/03/16 1005    Psychosocial Evaluation & Interventions   Interventions Stress management education;Relaxation education;Encouraged to exercise with the program and follow exercise prescription   Comments Counselor met with Mr. Reser today for initial psychosocial evaluation.  He is a 55 year old who has a strong family history of heart disease and recently had an incident himself.  He has a strong support system with a spouse of 51 years and several adult children.  He is also actively involved in his local church community.  Mr. Ivery had shoulder surgery in January so now he is recovering from this as well.  He denies sleeping well (maybe interrupted 5-6 hours per night) and denies a history of depression.  Although he admits to some anxiety symptoms currently with job stress in particular contributing to this.  He states his mood is generally positive.  Mr. Mehrer has goals to be educated on his limitations of exercise and to increase his stamina and strength while in this program.  Counselor made some recommendations for stress management with deep breathing practiced and consistent exercise.  Counselor also suggested checking with his doctor or pharmacist to see if an OTC sleep aid might be helpful during this time.     Continued Psychosocial Services Needed Yes  Mr. Poffenberger is recommended to practice deep breathing and consistent exercise to help with stress management.  In addition, he will benefit from that psychoeducational component of this program.  Counselor will follow with him re: these suggestions.       Psychosocial Re-Evaluation:     Psychosocial Re-Evaluation      04/21/16 0933 04/26/16 0958         Psychosocial Re-Evaluation   Comments Counselor follow up with Mr. Seymore today reporting feeling stronger now as he heads towards completion of this program.  He reports he is sleeping better and more consistently and he is feeling more stamina and strength since beginning  this program.  Mr. Elamin also is practicing better stress management skills as he reports setting better limits at work; delegating tasks; and learning to say "no" more often to others - especially at church.  Counselor commended Mr. Mulvehill for his hard work and positive outcomes so far.  He will be finishing up soon and  plans to consistently work out on his elliptical at home to maintain this progress.    Counselor follow up with Mr. Domine today as he reported struggling with relaxation techniques and stress from his job interfering with this process.  Counselor shared some mindfulness techniques with Mr. Delane Ginger and encouraged him to practice.  By the end of class, he reported trying this with some success already experienced.  Counselor will follow with him re: this in the future.           Vocational Rehabilitation: Provide vocational rehab assistance to qualifying candidates.   Vocational Rehab Evaluation & Intervention:     Vocational Rehab - 03/01/16 1401    Initial Vocational Rehab Evaluation & Intervention   Assessment shows need for Vocational Rehabilitation No      Education: Education Goals: Education classes will be provided on a weekly basis, covering required topics. Participant will state understanding/return demonstration of topics presented.  Learning Barriers/Preferences:     Learning Barriers/Preferences - 03/01/16 1441    Learning Barriers/Preferences   Learning Barriers None   Learning Preferences Computer/Internet;Individual Instruction;Video      Education Topics: General Nutrition Guidelines/Fats and Fiber: -Group instruction provided by verbal, written material, models and posters to present the general guidelines for heart healthy nutrition. Gives an explanation and review of dietary fats and fiber.          Cardiac Rehab from 04/26/2016 in [facility] Cardiac and Pulmonary Rehab   Date  04/12/16   Educator  Jaclyn Shaggy   Instruction Review Code  2- meets  goals/outcomes      Controlling Sodium/Reading Food Labels: -Group verbal and written material supporting the discussion of sodium use in heart healthy nutrition. Review and explanation with models, verbal and written materials for utilization of the food label.      Cardiac Rehab from 04/26/2016 in [facility] Cardiac and Pulmonary Rehab   Date  04/19/16   Educator  CR   Instruction Review Code  2- meets goals/outcomes      Exercise Physiology & Risk Factors: - Group verbal and written instruction with models to review the exercise physiology of the cardiovascular system and associated critical values. Details cardiovascular disease risk factors and the goals associated with each risk factor.      Cardiac Rehab from 04/26/2016 in [facility] Cardiac and Pulmonary Rehab   Date  04/21/16   Educator  [facility]   Instruction Review Code  2- meets goals/outcomes      Aerobic Exercise & Resistance Training: - Gives group verbal and written discussion on the health impact of inactivity. On the components of aerobic and resistive training programs and the benefits of this training and how to safely progress through these programs.      Cardiac Rehab from 04/26/2016 in [facility] Cardiac and Pulmonary Rehab   Date  04/26/16   Educator  [facility]   Instruction Review Code  2- meets goals/outcomes      Flexibility, Balance, General Exercise Guidelines: - Provides group verbal and written instruction on the benefits of flexibility and balance training programs. Provides general exercise guidelines with specific guidelines to those with heart or lung disease. Demonstration and  skill practice provided.      Cardiac Rehab from 04/26/2016 in [facility] Cardiac and Pulmonary Rehab   Date  03/03/16   Educator  bs   Instruction Review Code  2- meets goals/outcomes      Stress Management: - Provides group verbal and written instruction about the health risks of elevated stress, cause of high stress, and healthy ways to reduce stress.       Cardiac Rehab from 04/26/2016 in [facility] Cardiac and Pulmonary Rehab   Date  03/10/16   Educator  [facility]   Instruction Review Code  2- meets goals/outcomes      Depression: - Provides group verbal and written instruction on the correlation between heart/lung disease and depressed mood, treatment options, and the stigmas associated with seeking treatment.      Cardiac Rehab from 04/26/2016 in [facility] Cardiac and Pulmonary Rehab   Date  04/07/16   Educator  [facility]   Instruction Review Code  2- meets goals/outcomes      Anatomy & Physiology of the Heart: - Group verbal and written instruction and models provide basic cardiac anatomy and physiology, with the coronary electrical and arterial systems. Review of: AMI, Angina, Valve disease, Heart Failure, Cardiac Arrhythmia, Pacemakers, and the ICD.      Cardiac Rehab from 04/26/2016 in [facility] Cardiac and Pulmonary Rehab   Date  03/08/16   Educator  SB   Instruction Review Code  2- meets goals/outcomes      Cardiac Procedures: - Group verbal and written instruction and models to describe the testing methods done to diagnose heart disease. Reviews the outcomes of the test results. Describes the treatment choices: Medical Management, Angioplasty, or Coronary Bypass Surgery.      Cardiac Rehab from 04/26/2016 in [facility] Cardiac and Pulmonary Rehab   Date  03/15/16   Educator  CE   Instruction Review Code  2- meets goals/outcomes      Cardiac Medications: - Group verbal and written instruction to review commonly prescribed medications for heart disease. Reviews the medication, class of the drug, and side effects. Includes the steps to properly store meds and maintain the prescription regimen.   Go Sex-Intimacy & Heart Disease, Get SMART - Goal Setting: - Group verbal and written instruction through game format to discuss heart disease and the return to sexual intimacy. Provides group verbal and written material to discuss and apply goal setting through the  application of the S.M.A.R.T. Method.      Cardiac Rehab from 04/26/2016 in [facility] Cardiac and Pulmonary Rehab   Date  03/15/16   Educator  CE   Instruction Review Code  2- meets goals/outcomes      Other Matters of the Heart: - Provides group verbal, written materials and models to describe Heart Failure, Angina, Valve Disease, and Diabetes in the realm of heart disease. Includes description of the disease process and treatment options available to the cardiac patient.      Cardiac Rehab from 04/26/2016 in [facility] Cardiac and Pulmonary Rehab   Date  03/08/16   Educator  SB   Instruction Review Code  2- meets goals/outcomes      Exercise & Equipment Safety: - Individual verbal instruction and demonstration of equipment use and safety with use of the equipment.      Cardiac Rehab from 04/26/2016 in [facility] Cardiac and Pulmonary Rehab   Date  03/01/16   Educator  SB   Instruction Review Code  2- meets goals/outcomes      Infection Prevention: -  Provides verbal and written material to individual with discussion of infection control including proper hand washing and proper equipment cleaning during exercise session.      Cardiac Rehab from 04/26/2016 in [facility] Cardiac and Pulmonary Rehab   Date  03/01/16   Educator  SB   Instruction Review Code  2- meets goals/outcomes      Falls Prevention: - Provides verbal and written material to individual with discussion of falls prevention and safety.      Cardiac Rehab from 04/26/2016 in [facility] Cardiac and Pulmonary Rehab   Date  03/01/16   Educator  SB   Instruction Review Code  2- meets goals/outcomes      Diabetes: - Individual verbal and written instruction to review signs/symptoms of diabetes, desired ranges of glucose level fasting, after meals and with exercise. Advice that pre and post exercise glucose checks will be done for 3 sessions at entry of program.    Knowledge Questionnaire Score:     Knowledge Questionnaire Score - 03/01/16  1447    Knowledge Questionnaire Score   Pre Score 22/28      Core Components/Risk Factors/Patient Goals at Admission:     Personal Goals and Risk Factors at Admission - 03/01/16 1435    Core Components/Risk Factors/Patient Goals on Admission   Sedentary Yes   Intervention Provide advice, education, support and counseling about physical activity/exercise needs.;Develop an individualized exercise prescription for aerobic and resistive training based on initial evaluation findings, risk stratification, comorbidities and participant's personal goals.   Expected Outcomes Achievement of increased cardiorespiratory fitness and enhanced flexibility, muscular endurance and strength shown through measurements of functional capacity and personal statement of participant.   Stress Yes  Stress more work related.  Brian Waters has talked to his boss about making changes to reduce the work stress.   Intervention Offer individual and/or small group education and counseling on adjustment to heart disease, stress management and health-related lifestyle change. Teach and support self-help strategies.;Refer participants experiencing significant psychosocial distress to appropriate mental health specialists for further evaluation and treatment. When possible, include family members and significant others in education/counseling sessions.   Expected Outcomes Short Term: Participant demonstrates changes in health-related behavior, relaxation and other stress management skills, ability to obtain effective social support, and compliance with psychotropic medications if prescribed.;Long Term: Emotional wellbeing is indicated by absence of clinically significant psychosocial distress or social isolation.      Core Components/Risk Factors/Patient Goals Review:      Goals and Risk Factor Review      04/23/16 0802           Core Components/Risk Factors/Patient Goals Review   Personal Goals Review Sedentary;Stress;Lipids        Review Brian Waters has been doing great with exercise here and at home.  He is using an ellilptical at home and walking with dog.  He is also planning to join gym after graduation.  He has an appointment next week to check new lipids.  His stress levels have significantly decreased as he is now saying no to things at work.  (see pyschosocial note)        Expected Outcomes We will continue to work with Brian Waters to see continued improvements.          Core Components/Risk Factors/Patient Goals at Discharge (Final Review):      Goals and Risk Factor Review - 04/23/16 0802    Core Components/Risk Factors/Patient Goals Review   Personal Goals Review Sedentary;Stress;Lipids   Review Brian Waters has been  doing great with exercise here and at home.  He is using an ellilptical at home and walking with dog.  He is also planning to join gym after graduation.  He has an appointment next week to check new lipids.  His stress levels have significantly decreased as he is now saying no to things at work.  (see pyschosocial note)    Expected Outcomes We will continue to work with Brian Waters to see continued improvements.      ITP Comments:     ITP Comments      03/01/16 1359 03/03/16 0657 03/28/16 1252 04/02/16 0935 04/28/16 0732   ITP Comments inital med review completed.  ITP-continue with ITP 30 day review New to program  Continue with ITP 30 day review. Continue with ITP Brian Waters felt dizzy after his heart rate was 140 on the Elliptical standing elliptical then he went to a seat piece of equipment with a heart rate of 90 so dizzy and given 2 glasses of water so dizziness was resolved.  30 day review.  Continue with ITP  Doing well with ellipitical, keeping hydrated during exercise.      Comments:

## 2016-04-30 DIAGNOSIS — I214 Non-ST elevation (NSTEMI) myocardial infarction: Secondary | ICD-10-CM | POA: Diagnosis not present

## 2016-04-30 DIAGNOSIS — Z9861 Coronary angioplasty status: Secondary | ICD-10-CM

## 2016-04-30 NOTE — Progress Notes (Signed)
Discharge Summary  Patient Details  Name: Brian Waters MRN: [id number] Date of Birth: 1960/11/22 Referring Provider:        Cardiac Rehab from 04/28/2016 in [facility]   Referring Provider  Brian Bears  MD       Number of Visits:    Reason for Discharge:  Patient reached a stable level of exercise. Patient independent in their exercise.  Smoking History:  History  Smoking status  . Never Smoker   Smokeless tobacco  . Not on file    Diagnosis:  NSTEMI (non-ST elevated myocardial infarction) (HCC)  S/P PTCA (percutaneous transluminal coronary angioplasty)  ADL UCSD:   Initial Exercise Prescription:     Initial Exercise Prescription - 04/28/16 0800    Date of Initial Exercise RX and Referring Provider   Referring Provider Brian Bears  MD      Discharge Exercise Prescription (Final Exercise Prescription Changes):     Exercise Prescription Changes - 04/21/16 1100    Exercise Review   Progression Yes   Response to Exercise   Blood Pressure (Admit) 126/78 mmHg   Blood Pressure (Exercise) 148/70 mmHg   Blood Pressure (Exit) 122/60 mmHg   Heart Rate (Admit) 76 bpm   Heart Rate (Exercise) 150 bpm   Heart Rate (Exit) 95 bpm   Rating of Perceived Exertion (Exercise) 14   Symptoms none   Comments Home Exercise Givien 04/19/16   Duration Progress to 45 minutes of aerobic exercise without signs/symptoms of physical distress   Intensity THRR unchanged   Progression   Progression Continue to progress workloads to maintain intensity without signs/symptoms of physical distress.   Average METs 6.8   Resistance Training   Training Prescription Yes   Weight 4-5 lbs   Reps 10-15   Interval Training   Interval Training Yes   Equipment Treadmill;Elliptical   Comments 2 min 30 sec   Treadmill   MPH 3.3   Grade 8   Minutes 15   Elliptical   Level 9   Speed 4.8   Minutes 15   Home Exercise Plan   Plans to continue exercise at Home   walking and elliptical   Frequency Add 3 additional days to program exercise sessions.      Functional Capacity:     6 Minute Walk      03/01/16 1523 03/01/16 1536 04/21/16 0951   6 Minute Walk   Phase Initial  Discharge   Distance 1970 feet 1970 feet 2165 feet   Distance % Change   9.9 %   Walk Time 6 minutes  6 minutes   # of Rest Breaks   0   MPH 3.7  4.1   METS  35.14 5.98   RPE VO2 Peak   20.91   Symptoms   No   Resting HR 69 bpm 69 bpm 76 bpm   Resting BP 126/64 mmHg 126/64 mmHg 126/78 mmHg   Max Ex. HR 112 bpm 112 bpm 146 bpm   Max Ex. BP 140/76 mmHg 140/76 mmHg 148/70 mmHg      Psychological, QOL, Others - Outcomes: PHQ 2/9: Depression screen [facility] 2/9 04/28/2016 03/01/2016  Decreased Interest - 1  Down, Depressed, Hopeless 1 1  PHQ - 2 Score 1 2  Altered sleeping 1 1  Tired, decreased energy 1 1  Change in appetite 0 1  Feeling bad or failure about yourself  0 1  Trouble concentrating 0 1  Moving  slowly or fidgety/restless 0 1  Suicidal thoughts 0 0  PHQ-9 Score 3 8  Difficult doing work/chores Not difficult at all Somewhat difficult    Quality of Life:     Quality of Life - 04/28/16 1012    Quality of Life Scores   Health/Function Post 21.83 %   Socioeconomic Post 22 %   Psych/Spiritual Post 20.07 %   Family Post 27.6 %   GLOBAL Post 22.35 %      Personal Goals: Goals established at orientation with interventions provided to work toward goal.     Personal Goals and Risk Factors at Admission - 03/01/16 1435    Core Components/Risk Factors/Patient Goals on Admission   Sedentary Yes   Intervention Provide advice, education, support and counseling about physical activity/exercise needs.;Develop an individualized exercise prescription for aerobic and resistive training based on initial evaluation findings, risk stratification, comorbidities and participant's personal goals.   Expected Outcomes Achievement of increased cardiorespiratory  fitness and enhanced flexibility, muscular endurance and strength shown through measurements of functional capacity and personal statement of participant.   Stress Yes  Stress more work related.  Brian Waters has talked to his boss about making changes to reduce the work stress.   Intervention Offer individual and/or small group education and counseling on adjustment to heart disease, stress management and health-related lifestyle change. Teach and support self-help strategies.;Refer participants experiencing significant psychosocial distress to appropriate mental health specialists for further evaluation and treatment. When possible, include family members and significant others in education/counseling sessions.   Expected Outcomes Short Term: Participant demonstrates changes in health-related behavior, relaxation and other stress management skills, ability to obtain effective social support, and compliance with psychotropic medications if prescribed.;Long Term: Emotional wellbeing is indicated by absence of clinically significant psychosocial distress or social isolation.       Personal Goals Discharge:     Goals and Risk Factor Review      04/23/16 0802 04/30/16 1019         Core Components/Risk Factors/Patient Goals Review   Personal Goals Review Sedentary;Stress;Lipids Sedentary      Review Brian Waters with exercise here and at home.  He is using an ellilptical at home and walking with dog.  He is also planning to join gym after graduation.  He has an appointment next week to check new lipids.  His stress levels have significantly decreased as he is now saying no to things at work.  (see pyschosocial note)  Brian Waters on the Elliptical and treadmill.       Expected Outcomes We will continue to work with Brian Romneyoug to see continued improvements.          Nutrition & Weight - Outcomes:     Pre Biometrics - 03/01/16 1524    Pre Biometrics   Height 5\' 10"  (1.778 m)   Weight 195 lb  1.6 oz (88.497 kg)   Waist Circumference 40.25 inches   Hip Circumference 43.5 inches   Waist to Hip Ratio 0.93 %   BMI (Calculated) 28.1       Nutrition:     Nutrition Therapy & Goals - 04/02/16 1056    Nutrition Therapy   Diet Instructed on Heart Healthy dietary guidelines including DASH diet principles.   Drug/Food Interactions Statins/Certain Fruits   Protein (specify units) 8   Fiber 30 grams   Whole Grain Foods 3 servings   Saturated Fats 13 max. grams  trans fat-0   Fruits and Vegetables 5  servings/day   Sodium 1500 grams   Personal Nutrition Goals   Personal Goal #1 Read labels for saturated fat, trans fat and sodium.   Personal Goal #2 Include at least 3 servings of whole grains daily.   Intervention Plan   Intervention Prescribe, educate and counsel regarding individualized specific dietary modifications aiming towards targeted core components such as weight, hypertension, lipid management, diabetes, heart failure and other comorbidities.;Nutrition handout(s) given to patient.   Expected Outcomes Short Term Goal: Understand basic principles of dietary content, such as calories, fat, sodium, cholesterol and nutrients.;Short Term Goal: A plan has been developed with personal nutrition goals set during dietitian appointment.;Long Term Goal: Adherence to prescribed nutrition plan.      Nutrition Discharge:     Nutrition Assessments - 04/28/16 1015    Rate Your Plate Scores   Post Score 84   Post Score % 93 %      Education Questionnaire Score:     Knowledge Questionnaire Score - 04/28/16 1016    Knowledge Questionnaire Score   Post Score 26      Goals reviewed with patient; copy given to patient.

## 2016-04-30 NOTE — Patient Instructions (Signed)
Discharge Instructions  Patient Details  Name: Brian Waters MRN: [id number] Date of Birth: 02/13/1961 Referring Provider:  Iran OuchArida, Waters A, MD   Number of Visits:   Reason for Discharge:  Patient reached a stable level of exercise. Patient independent in their exercise.  Smoking History:  History  Smoking status  . Never Smoker   Smokeless tobacco  . Not on file    Diagnosis:  NSTEMI (non-ST elevated myocardial infarction) (HCC)  S/P PTCA (percutaneous transluminal coronary angioplasty)  Initial Exercise Prescription:     Initial Exercise Prescription - 04/28/16 0800    Date of Initial Exercise RX and Referring Provider   Referring Provider Brian BearsArida, [name]  MD      Discharge Exercise Prescription (Final Exercise Prescription Changes):     Exercise Prescription Changes - 04/21/16 1100    Exercise Review   Progression Yes   Response to Exercise   Blood Pressure (Admit) 126/78 mmHg   Blood Pressure (Exercise) 148/70 mmHg   Blood Pressure (Exit) 122/60 mmHg   Heart Rate (Admit) 76 bpm   Heart Rate (Exercise) 150 bpm   Heart Rate (Exit) 95 bpm   Rating of Perceived Exertion (Exercise) 14   Symptoms none   Comments Home Exercise Givien 04/19/16   Duration Progress to 45 minutes of aerobic exercise without signs/symptoms of physical distress   Intensity THRR unchanged   Progression   Progression Continue to progress workloads to maintain intensity without signs/symptoms of physical distress.   Average METs 6.8   Resistance Training   Training Prescription Yes   Weight 4-5 lbs   Reps 10-15   Interval Training   Interval Training Yes   Equipment Treadmill;Elliptical   Comments 2 min 30 sec   Treadmill   MPH 3.3   Grade 8   Minutes 15   Elliptical   Level 9   Speed 4.8   Minutes 15   Home Exercise Plan   Plans to continue exercise at Home  walking and elliptical   Frequency Add 3 additional days to program exercise sessions.      Functional  Capacity:     6 Minute Walk      03/01/16 1523 03/01/16 1536 04/21/16 0951   6 Minute Walk   Phase Initial  Discharge   Distance 1970 feet 1970 feet 2165 feet   Distance % Change   9.9 %   Walk Time 6 minutes  6 minutes   # of Rest Breaks   0   MPH 3.7  4.1   METS  35.14 5.98   RPE 13 13 15    VO2 Peak   20.91   Symptoms   No   Resting HR 69 bpm 69 bpm 76 bpm   Resting BP 126/64 mmHg 126/64 mmHg 126/78 mmHg   Max Ex. HR 112 bpm 112 bpm 146 bpm   Max Ex. BP 140/76 mmHg 140/76 mmHg 148/70 mmHg      Quality of Life:     Quality of Life - 04/28/16 1012    Quality of Life Scores   Health/Function Post 21.83 %   Socioeconomic Post 22 %   Psych/Spiritual Post 20.07 %   Family Post 27.6 %   GLOBAL Post 22.35 %      Personal Goals: Goals established at orientation with interventions provided to work toward goal.     Personal Goals and Risk Factors at Admission - 03/01/16 1435    Core Components/Risk Factors/Patient Goals on Admission   Sedentary Yes  Intervention Provide advice, education, support and counseling about physical activity/exercise needs.;Develop an individualized exercise prescription for aerobic and resistive training based on initial evaluation findings, risk stratification, comorbidities and participant's personal goals.   Expected Outcomes Achievement of increased cardiorespiratory fitness and enhanced flexibility, muscular endurance and strength shown through measurements of functional capacity and personal statement of participant.   Stress Yes  Stress more work related.  Brian Waters has talked to his boss about making changes to reduce the work stress.   Intervention Offer individual and/or small group education and counseling on adjustment to heart disease, stress management and health-related lifestyle change. Teach and support self-help strategies.;Refer participants experiencing significant psychosocial distress to appropriate mental health specialists for  further evaluation and treatment. When possible, include family members and significant others in education/counseling sessions.   Expected Outcomes Short Term: Participant demonstrates changes in health-related behavior, relaxation and other stress management skills, ability to obtain effective social support, and compliance with psychotropic medications if prescribed.;Long Term: Emotional wellbeing is indicated by absence of clinically significant psychosocial distress or social isolation.       Personal Goals Discharge:     Goals and Risk Factor Review - 04/30/16 1019    Core Components/Risk Factors/Patient Goals Review   Personal Goals Review Sedentary   Review Brian Waters does great on the Elliptical and treadmill.       Nutrition & Weight - Outcomes:     Pre Biometrics - 03/01/16 1524    Pre Biometrics   Height 5\' 10"  (1.778 m)   Weight 195 lb 1.6 oz (88.497 kg)   Waist Circumference 40.25 inches   Hip Circumference 43.5 inches   Waist to Hip Ratio 0.93 %   BMI (Calculated) 28.1       Nutrition:     Nutrition Therapy & Goals - 04/02/16 1056    Nutrition Therapy   Diet Instructed on Heart Healthy dietary guidelines including DASH diet principles.   Drug/Food Interactions Statins/Certain Fruits   Protein (specify units) 8   Fiber 30 grams   Whole Grain Foods 3 servings   Saturated Fats 13 max. grams  trans fat-0   Fruits and Vegetables 5 servings/day   Sodium 1500 grams   Personal Nutrition Goals   Personal Goal #1 Read labels for saturated fat, trans fat and sodium.   Personal Goal #2 Include at least 3 servings of whole grains daily.   Intervention Plan   Intervention Prescribe, educate and counsel regarding individualized specific dietary modifications aiming towards targeted core components such as weight, hypertension, lipid management, diabetes, heart failure and other comorbidities.;Nutrition handout(s) given to patient.   Expected Outcomes Short Term Goal:  Understand basic principles of dietary content, such as calories, fat, sodium, cholesterol and nutrients.;Short Term Goal: A plan has been developed with personal nutrition goals set during dietitian appointment.;Long Term Goal: Adherence to prescribed nutrition plan.      Nutrition Discharge:     Nutrition Assessments - 04/28/16 1015    Rate Your Plate Scores   Post Score 84   Post Score % 93 %      Education Questionnaire Score:     Knowledge Questionnaire Score - 04/28/16 1016    Knowledge Questionnaire Score   Post Score 26      Goals reviewed with patient; copy given to patient.

## 2016-04-30 NOTE — Progress Notes (Signed)
Daily Session Note  Patient Details  Name: Brian Waters MRN: [id number] Date of Birth: [birth year] Referring Provider:        Cardiac Rehab from 04/28/2016 in [facility] Cardiac and Pul[facility] Rehab   Referring Provider  Kathlyn Sacramento  MD      Encounter Date: 04/30/2016  Check In:     Session Check In - 04/30/16 0829    Check-In   Location [facility]-Cardiac & Pulmonary Rehab   Staff Present Gerlene Burdock, RN, BSN;Carmen Tolliver, DPT, Everette Rank, MA, ACSM RCEP, Exercise Physiologist   Supervising physician immediately available to respond to emergencies See telemetry face sheet for immediately available ER MD   Medication changes reported     No   Fall or balance concerns reported    No   Warm-up and Cool-down Performed on first and last piece of equipment   Resistance Training Performed Yes   VAD Patient? No   Pain Assessment   Currently in Pain? No/denies   Multiple Pain Sites No         Goals Met:  Independence with exercise equipment No report of cardiac concerns or symptoms  Goals Unmet:  Not Applicable  Comments: Patient completed exercise prescription and all exercise goals during rehab session. The exercise was tolerated well and the patient is progressing in the program.   Dr. Emily Filbert is Medical Director for [name].

## 2016-04-30 NOTE — Progress Notes (Signed)
Cardiac Individual Treatment Plan  Patient Details  Name: Brian Waters MRN: [id number] Date of Birth: 1961/05/30 Referring Provider:        Cardiac Rehab from 04/28/2016 in [facility] Cardiac and Pulmonary Rehab   Referring Provider  Kathlyn Sacramento  MD      Initial Encounter Date:       Cardiac Rehab from 04/28/2016 in [facility] Cardiac and Pulmonary Rehab   Referring Provider  Kathlyn Sacramento  MD      Visit Diagnosis: NSTEMI (non-ST elevated myocardial infarction) ([place])  S/P PTCA (percutaneous transluminal coronary angioplasty)  Patient's Home Medications on Admission:  Current outpatient prescriptions:  .  aspirin EC 81 MG tablet, Take 1 tablet (81 mg total) by mouth daily., Disp: 60 tablet, Rfl:  .  atorvastatin (LIPITOR) 40 MG tablet, Take 1 tablet (40 mg total) by mouth daily at 6 PM., Disp: 60 tablet, Rfl: 1 .  fluticasone (FLONASE) 50 MCG/ACT nasal spray, Place 2 sprays into both nostrils daily. , Disp: , Rfl:  .  loratadine (CLARITIN) 10 MG tablet, Take 10 mg by mouth daily., Disp: , Rfl:  .  metoprolol tartrate (LOPRESSOR) 25 MG tablet, Take 0.5 tablets (12.5 mg total) by mouth 2 (two) times daily. (Patient not taking: Reported on 03/01/2016), Disp: 60 tablet, Rfl: 1 .  Multiple Vitamin (MULTIVITAMIN) tablet, Take 1 tablet by mouth daily., Disp: , Rfl:  .  naproxen sodium (ANAPROX) 220 MG tablet, Take 220 mg by mouth 2 (two) times daily., Disp: , Rfl:  .  ticagrelor (BRILINTA) 90 MG TABS tablet, Take 1 tablet (90 mg total) by mouth 2 (two) times daily., Disp: 60 tablet, Rfl: 1  Past Medical History: Past Medical History  Diagnosis Date  . Seasonal allergies     Tobacco Use: History  Smoking status  . Never Smoker   Smokeless tobacco  . Not on file    Labs: Recent Review Flowsheet Data    There is no flowsheet data to display.       Exercise Target Goals:    Exercise Program Goal: Individual exercise prescription set with THRR, safety & activity barriers.  Participant demonstrates ability to understand and report RPE using BORG scale, to self-measure pulse accurately, and to acknowledge the importance of the exercise prescription.  Exercise Prescription Goal: Starting with aerobic activity 30 plus minutes a day, 3 days per week for initial exercise prescription. Provide home exercise prescription and guidelines that participant acknowledges understanding prior to discharge.  Activity Barriers & Risk Stratification:     Activity Barriers & Cardiac Risk Stratification - 03/01/16 1441    Activity Barriers & Cardiac Risk Stratification   Cardiac Risk Stratification Moderate      6 Minute Walk:     6 Minute Walk      03/01/16 1523 03/01/16 1536 04/21/16 0951   6 Minute Walk   Phase Initial  Discharge   Distance 1970 feet 1970 feet 2165 feet   Distance % Change   9.9 %   Walk Time 6 minutes  6 minutes   # of Rest Breaks   0   MPH 3.7  4.1   METS  35.14 5.98   RPE _0 VO2 Peak   20.91   Symptoms   No   Resting HR 69 bpm 69 bpm 76 bpm   Resting BP 126/64 mmHg 126/64 mmHg 126/78 mmHg   Max Ex. HR 112 bpm 112 bpm 146 bpm   Max Ex. BP 140/76 mmHg  140/76 mmHg 148/70 mmHg      Initial Exercise Prescription:     Initial Exercise Prescription - 04/28/16 0800    Date of Initial Exercise RX and Referring Provider   Referring Provider Kathlyn Sacramento  MD      Perform Capillary Blood Glucose checks as needed.  Exercise Prescription Changes:     Exercise Prescription Changes      03/01/16 1500 03/17/16 0700 04/07/16 1200 04/19/16 0900 04/21/16 1100   Exercise Review   Progression  Yes Yes Yes Yes   Response to Exercise   Blood Pressure (Admit) 126/64 mmHg 138/78 mmHg 120/80 mmHg  126/78 mmHg   Blood Pressure (Exercise) 140/76 mmHg 152/70 mmHg 158/72 mmHg  148/70 mmHg   Blood Pressure (Exit) 126/84 mmHg 132/82 mmHg 120/76 mmHg  122/60 mmHg   Heart Rate (Admit) 76 bpm 85 bpm 82 bpm  76 bpm   Heart Rate (Exercise) 113 bpm  145 bpm  Exercise HR ranged from 99 (REL)-145(AD) 139 bpm  150 bpm   Heart Rate (Exit) 75 bpm 92 bpm 91 bpm  95 bpm   Rating of Perceived Exertion (Exercise)   15  14   Symptoms  none none none none   Comments    Home Exercise Givien 04/19/16 Home Exercise Givien 04/19/16   Duration  Progress to 45 minutes of aerobic exercise without signs/symptoms of physical distress Progress to 30 minutes of continuous aerobic without signs/symptoms of physical distress Progress to 30 minutes of continuous aerobic without signs/symptoms of physical distress Progress to 45 minutes of aerobic exercise without signs/symptoms of physical distress   Intensity  Rest + 40 THRR New  112-148 THRR New  112-148 THRR unchanged   Progression   Progression  Continue progressive overload as per policy without signs/symptoms or physical distress. Continue to progress workloads to maintain intensity without signs/symptoms of physical distress. Continue to progress workloads to maintain intensity without signs/symptoms of physical distress. Continue to progress workloads to maintain intensity without signs/symptoms of physical distress.   Average METs   4.23  treadmill 4.23  treadmill 6.8   Resistance Training   Training Prescription  Yes Yes Yes Yes   Weight  3 4-5 lbs  4 left hand, 5 right hand 4-5 lbs  4 left hand, 5 right hand 4-5 lbs   Reps  10-15  Pt recently had left shoulder surgery.   10-15 10-15 10-15   Interval Training   Interval Training   No Yes Yes   Equipment    Treadmill;Elliptical Treadmill;Elliptical   Comments    2 min 30 sec 2 min 30 sec   Treadmill   MPH  3 3.2 3.2 3.3   Grade  _0 Minutes  _1 Bike   Level  1.6  AD bike  1.6 1.6    Minutes  _2 Elliptical   Level   _3 Speed   4.5 4.5 4.8   Minutes   _4 REL-XR   Level  _5 Watts  80 99 99    Minutes  _6 Home Exercise Plan   Plans to continue exercise at    Home  walking and elliptical  Home  walking and elliptical   Frequency    Add 3 additional days to program exercise sessions. Add 3 additional days to program exercise sessions.  Exercise Comments:     Exercise Comments      03/03/16 0843 03/17/16 0718 03/17/16 0719 04/07/16 1241 04/19/16 0904   Exercise Comments First day of exercise!Brian Waters was oriented to the gym and the equipment functions and settings. Procedures and policies of the gym were outlined and explained. The patient's individual exercise prescription and treatment plan were reviewed with him. All starting workloads were established based on the results of the functional testing  done at the initial intake visit. The plan for exercise progression was also introduced and progression will be customized based on the patient's performance and goals.  Reviewed individualized exercise prescription and made increases per departmental policy. Exercise increases were discussed with the patient and they were able to perform the new work loads without issue (no signs or symptoms).  Brian Waters has gotten off to a great start with his exercise program. He has tolerated his exercise well and has shown increases in his strengh and stamina by increasing his intensities and duration of exercise. His THRR was increased to rest +40 to accomodate his ability to challenge himself with exercise.  Brian Waters has continued to make progress with exercise.  He is feeling more challenged during exercise.  We will discuss adding in interval training at next exercise session.  He enjoys the challenge on the ellipitcal. Reviewed home exercise with pt today.  Pt plans to walk with dog and use elliptical at home for exercise.  Reviewed THR, pulse, RPE, sign and symptoms, and when to call 911 or MD.  Also discussed weather considerations and indoor options.  Pt voiced understanding.     04/21/16 0953           Exercise Comments Brian Waters completed his post 6 min walk test today.  He made a 9.9% improvement  (195 ft).  He is doing great with his exercise.  He is enjoyoing interval training.          Discharge Exercise Prescription (Final Exercise Prescription Changes):     Exercise Prescription Changes - 04/21/16 1100    Exercise Review   Progression Yes   Response to Exercise   Blood Pressure (Admit) 126/78 mmHg   Blood Pressure (Exercise) 148/70 mmHg   Blood Pressure (Exit) 122/60 mmHg   Heart Rate (Admit) 76 bpm   Heart Rate (Exercise) 150 bpm   Heart Rate (Exit) 95 bpm   Rating of Perceived Exertion (Exercise) 14   Symptoms none   Comments Home Exercise Givien 04/19/16   Duration Progress to 45 minutes of aerobic exercise without signs/symptoms of physical distress   Intensity THRR unchanged   Progression   Progression Continue to progress workloads to maintain intensity without signs/symptoms of physical distress.   Average METs 6.8   Resistance Training   Training Prescription Yes   Weight 4-5 lbs   Reps 10-15   Interval Training   Interval Training Yes   Equipment Treadmill;Elliptical   Comments 2 min 30 sec   Treadmill   MPH 3.3   Grade 8   Minutes 15   Elliptical   Level 9   Speed 4.8   Minutes 15   Home Exercise Plan   Plans to continue exercise at Home  walking and elliptical   Frequency Add 3 additional days to program exercise sessions.      Nutrition:  Target Goals: Understanding of nutrition guidelines, daily intake of sodium <1538m, cholesterol <2064m calories 30% from fat and 7% or less from saturated fats, daily to have 5  or more servings of fruits and vegetables.  Biometrics:     Pre Biometrics - 03/01/16 1524    Pre Biometrics   Height 5' 10" (1.778 m)   Weight 195 lb 1.6 oz (88.497 kg)   Waist Circumference 40.25 inches   Hip Circumference 43.5 inches   Waist to Hip Ratio 0.93 %   BMI (Calculated) 28.1       Nutrition Therapy Plan and Nutrition Goals:     Nutrition Therapy & Goals - 04/02/16 1056    Nutrition Therapy   Diet  Instructed on Heart Healthy dietary guidelines including DASH diet principles.   Drug/Food Interactions Statins/Certain Fruits   Protein (specify units) 8   Fiber 30 grams   Whole Grain Foods 3 servings   Saturated Fats 13 max. grams  trans fat-0   Fruits and Vegetables 5 servings/day   Sodium 1500 grams   Personal Nutrition Goals   Personal Goal #1 Read labels for saturated fat, trans fat and sodium.   Personal Goal #2 Include at least 3 servings of whole grains daily.   Intervention Plan   Intervention Prescribe, educate and counsel regarding individualized specific dietary modifications aiming towards targeted core components such as weight, hypertension, lipid management, diabetes, heart failure and other comorbidities.;Nutrition handout(s) given to patient.   Expected Outcomes Short Term Goal: Understand basic principles of dietary content, such as calories, fat, sodium, cholesterol and nutrients.;Short Term Goal: A plan has been developed with personal nutrition goals set during dietitian appointment.;Long Term Goal: Adherence to prescribed nutrition plan.      Nutrition Discharge: Rate Your Plate Scores:     Nutrition Assessments - 04/28/16 1015    Rate Your Plate Scores   Post Score 84   Post Score % 93 %      Nutrition Goals Re-Evaluation:   Psychosocial: Target Goals: Acknowledge presence or absence of depression, maximize coping skills, provide positive support system. Participant is able to verbalize types and ability to use techniques and skills needed for reducing stress and depression.  Initial Review & Psychosocial Screening:     Initial Psych Review & Screening - 03/01/16 1437    Initial Review   Current issues with Current Stress Concerns  Stress issiues with work and family. Overwhelmed with current medical diagnosis.  Has stated  he took time off this week and that time away form work has helped reduce the stress feelings.    Source of Stress Concerns  Occupation   Comments One child in college and one in high school.   Another graduated from college.    Family Dynamics   Good Support System? Yes  Wife and children   Barriers   Psychosocial barriers to participate in program There are no identifiable barriers or psychosocial needs.;The patient should benefit from training in stress management and relaxation.   Screening Interventions   Interventions Encouraged to exercise      Quality of Life Scores:     Quality of Life - 04/28/16 1012    Quality of Life Scores   Health/Function Post 21.83 %   Socioeconomic Post 22 %   Psych/Spiritual Post 20.07 %   Family Post 27.6 %   GLOBAL Post 22.35 %      PHQ-9:     Recent Review Flowsheet Data    Depression screen [facility] 2/9 04/28/2016 03/01/2016   Decreased Interest - 1   Down, Depressed, Hopeless 1 1   PHQ - 2 Score 1 2   Altered sleeping 1  1   Tired, decreased energy 1 1   Change in appetite 0 1   Feeling bad or failure about yourself  0 1   Trouble concentrating 0 1   Moving slowly or fidgety/restless 0 1   Suicidal thoughts 0 0   PHQ-9 Score 3 8   Difficult doing work/chores Not difficult at all Somewhat difficult       Psychosocial Evaluation and Intervention:     Psychosocial Evaluation - 03/03/16 1005    Psychosocial Evaluation & Interventions   Interventions Stress management education;Relaxation education;Encouraged to exercise with the program and follow exercise prescription   Comments Counselor met with Brian Waters today for initial psychosocial evaluation.  He is a 55 year old who has a strong family history of heart disease and recently had an incident himself.  He has a strong support system with a spouse of 63 years and several adult children.  He is also actively involved in his local church community.  Brian Waters had shoulder surgery in January so now he is recovering from this as well.  He denies sleeping well (maybe interrupted 5-6 hours per night) and  denies a history of depression.  Although he admits to some anxiety symptoms currently with job stress in particular contributing to this.  He states his mood is generally positive.  Brian Waters has goals to be educated on his limitations of exercise and to increase his stamina and strength while in this program.  Counselor made some recommendations for stress management with deep breathing practiced and consistent exercise.  Counselor also suggested checking with his doctor or pharmacist to see if an OTC sleep aid might be helpful during this time.     Continued Psychosocial Services Needed Yes  Brian Waters is recommended to practice deep breathing and consistent exercise to help with stress management.  In addition, he will benefit from that psychoeducational component of this program.  Counselor will follow with him re: these suggestions.       Psychosocial Re-Evaluation:     Psychosocial Re-Evaluation      04/21/16 0933 04/26/16 0958         Psychosocial Re-Evaluation   Comments Counselor follow up with Brian Waters today reporting feeling stronger now as he heads towards completion of this program.  He reports he is sleeping better and more consistently and he is feeling more stamina and strength since beginning this program.  Brian Waters also is practicing better stress management skills as he reports setting better limits at work; delegating tasks; and learning to say "no" more often to others - especially at church.  Counselor commended Brian Waters for his hard work and positive outcomes so far.  He will be finishing up soon and  plans to consistently work out on his elliptical at home to maintain this progress.    Counselor follow up with Brian Waters today as he reported struggling with relaxation techniques and stress from his job interfering with this process.  Counselor shared some mindfulness techniques with Brian Waters and encouraged him to practice.  By the end of class, he reported trying  this with some success already experienced.  Counselor will follow with him re: this in the future.           Vocational Rehabilitation: Provide vocational rehab assistance to qualifying candidates.   Vocational Rehab Evaluation & Intervention:     Vocational Rehab - 03/01/16 1401    Initial Vocational Rehab Evaluation & Intervention   Assessment shows  need for Vocational Rehabilitation No      Education: Education Goals: Education classes will be provided on a weekly basis, covering required topics. Participant will state understanding/return demonstration of topics presented.  Learning Barriers/Preferences:     Learning Barriers/Preferences - 03/01/16 1441    Learning Barriers/Preferences   Learning Barriers None   Learning Preferences Computer/Internet;Individual Instruction;Video      Education Topics: General Nutrition Guidelines/Fats and Fiber: -Group instruction provided by verbal, written material, models and posters to present the general guidelines for heart healthy nutrition. Gives an explanation and review of dietary fats and fiber.          Cardiac Rehab from 04/28/2016 in [facility] Cardiac and Pulmonary Rehab   Date  04/12/16   Educator  Jaclyn Shaggy   Instruction Review Code  2- meets goals/outcomes      Controlling Sodium/Reading Food Labels: -Group verbal and written material supporting the discussion of sodium use in heart healthy nutrition. Review and explanation with models, verbal and written materials for utilization of the food label.      Cardiac Rehab from 04/28/2016 in [facility] Cardiac and Pulmonary Rehab   Date  04/19/16   Educator  CR   Instruction Review Code  2- meets goals/outcomes      Exercise Physiology & Risk Factors: - Group verbal and written instruction with models to review the exercise physiology of the cardiovascular system and associated critical values. Details cardiovascular disease risk factors and the goals associated with each risk  factor.      Cardiac Rehab from 04/28/2016 in [facility] Cardiac and Pulmonary Rehab   Date  04/21/16   Educator  [facility]   Instruction Review Code  2- meets goals/outcomes      Aerobic Exercise & Resistance Training: - Gives group verbal and written discussion on the health impact of inactivity. On the components of aerobic and resistive training programs and the benefits of this training and how to safely progress through these programs.      Cardiac Rehab from 04/28/2016 in [facility] Cardiac and Pulmonary Rehab   Date  04/26/16   Educator  [facility]   Instruction Review Code  2- meets goals/outcomes      Flexibility, Balance, General Exercise Guidelines: - Provides group verbal and written instruction on the benefits of flexibility and balance training programs. Provides general exercise guidelines with specific guidelines to those with heart or lung disease. Demonstration and skill practice provided.      Cardiac Rehab from 04/28/2016 in [facility] Cardiac and Pulmonary Rehab   Date  04/28/16   Educator  AS   Instruction Review Code  R- Review/reinforce [Second Class]      Stress Management: - Provides group verbal and written instruction about the health risks of elevated stress, cause of high stress, and healthy ways to reduce stress.      Cardiac Rehab from 04/28/2016 in [facility] Cardiac and Pulmonary Rehab   Date  03/10/16   Educator  [facility]   Instruction Review Code  2- meets goals/outcomes      Depression: - Provides group verbal and written instruction on the correlation between heart/lung disease and depressed mood, treatment options, and the stigmas associated with seeking treatment.      Cardiac Rehab from 04/28/2016 in [facility] Cardiac and Pulmonary Rehab   Date  04/07/16   Educator  [facility]   Instruction Review Code  2- meets goals/outcomes      Anatomy & Physiology of the Heart: - Group verbal and written instruction and  models provide basic cardiac anatomy and physiology, with the coronary electrical and  arterial systems. Review of: AMI, Angina, Valve disease, Heart Failure, Cardiac Arrhythmia, Pacemakers, and the ICD.      Cardiac Rehab from 04/28/2016 in [facility] Cardiac and Pulmonary Rehab   Date  03/08/16   Educator  SB   Instruction Review Code  2- meets goals/outcomes      Cardiac Procedures: - Group verbal and written instruction and models to describe the testing methods done to diagnose heart disease. Reviews the outcomes of the test results. Describes the treatment choices: Medical Management, Angioplasty, or Coronary Bypass Surgery.      Cardiac Rehab from 04/28/2016 in [facility] Cardiac and Pulmonary Rehab   Date  03/15/16   Educator  CE   Instruction Review Code  2- meets goals/outcomes      Cardiac Medications: - Group verbal and written instruction to review commonly prescribed medications for heart disease. Reviews the medication, class of the drug, and side effects. Includes the steps to properly store meds and maintain the prescription regimen.   Go Sex-Intimacy & Heart Disease, Get SMART - Goal Setting: - Group verbal and written instruction through game format to discuss heart disease and the return to sexual intimacy. Provides group verbal and written material to discuss and apply goal setting through the application of the S.M.A.R.T. Method.      Cardiac Rehab from 04/28/2016 in [facility] Cardiac and Pulmonary Rehab   Date  03/15/16   Educator  CE   Instruction Review Code  2- meets goals/outcomes      Other Matters of the Heart: - Provides group verbal, written materials and models to describe Heart Failure, Angina, Valve Disease, and Diabetes in the realm of heart disease. Includes description of the disease process and treatment options available to the cardiac patient.      Cardiac Rehab from 04/28/2016 in [facility] Cardiac and Pulmonary Rehab   Date  03/08/16   Educator  SB   Instruction Review Code  2- meets goals/outcomes      Exercise & Equipment Safety: - Individual  verbal instruction and demonstration of equipment use and safety with use of the equipment.      Cardiac Rehab from 04/28/2016 in [facility] Cardiac and Pulmonary Rehab   Date  03/01/16   Educator  SB   Instruction Review Code  2- meets goals/outcomes      Infection Prevention: - Provides verbal and written material to individual with discussion of infection control including proper hand washing and proper equipment cleaning during exercise session.      Cardiac Rehab from 04/28/2016 in [facility] Cardiac and Pulmonary Rehab   Date  03/01/16   Educator  SB   Instruction Review Code  2- meets goals/outcomes      Falls Prevention: - Provides verbal and written material to individual with discussion of falls prevention and safety.      Cardiac Rehab from 04/28/2016 in [facility] Cardiac and Pulmonary Rehab   Date  03/01/16   Educator  SB   Instruction Review Code  2- meets goals/outcomes      Diabetes: - Individual verbal and written instruction to review signs/symptoms of diabetes, desired ranges of glucose level fasting, after meals and with exercise. Advice that pre and post exercise glucose checks will be done for 3 sessions at entry of program.    Knowledge Questionnaire Score:     Knowledge Questionnaire Score - 04/28/16 1016    Knowledge Questionnaire Score   Post  Score 26      Core Components/Risk Factors/Patient Goals at Admission:     Personal Goals and Risk Factors at Admission - 03/01/16 1435    Core Components/Risk Factors/Patient Goals on Admission   Sedentary Yes   Intervention Provide advice, education, support and counseling about physical activity/exercise needs.;Develop an individualized exercise prescription for aerobic and resistive training based on initial evaluation findings, risk stratification, comorbidities and participant's personal goals.   Expected Outcomes Achievement of increased cardiorespiratory fitness and enhanced flexibility, muscular endurance and strength  shown through measurements of functional capacity and personal statement of participant.   Stress Yes  Stress more work related.  Brian Waters has talked to his boss about making changes to reduce the work stress.   Intervention Offer individual and/or small group education and counseling on adjustment to heart disease, stress management and health-related lifestyle change. Teach and support self-help strategies.;Refer participants experiencing significant psychosocial distress to appropriate mental health specialists for further evaluation and treatment. When possible, include family members and significant others in education/counseling sessions.   Expected Outcomes Short Term: Participant demonstrates changes in health-related behavior, relaxation and other stress management skills, ability to obtain effective social support, and compliance with psychotropic medications if prescribed.;Long Term: Emotional wellbeing is indicated by absence of clinically significant psychosocial distress or social isolation.      Core Components/Risk Factors/Patient Goals Review:      Goals and Risk Factor Review      04/23/16 0802 04/30/16 1019         Core Components/Risk Factors/Patient Goals Review   Personal Goals Review Sedentary;Stress;Lipids Sedentary      Review Brian Waters has been doing great with exercise here and at home.  He is using an ellilptical at home and walking with dog.  He is also planning to join gym after graduation.  He has an appointment next week to check new lipids.  His stress levels have significantly decreased as he is now saying no to things at work.  (see pyschosocial note)  Brian Waters does great on the Elliptical and treadmill.       Expected Outcomes We will continue to work with Brian Waters to see continued improvements.          Core Components/Risk Factors/Patient Goals at Discharge (Final Review):      Goals and Risk Factor Review - 04/30/16 1019    Core Components/Risk Factors/Patient Goals  Review   Personal Goals Review Sedentary   Review Brian Waters does great on the Elliptical and treadmill.       ITP Comments:     ITP Comments      03/01/16 1359 03/03/16 0657 03/28/16 1252 04/02/16 0935 04/28/16 0732   ITP Comments inital med review completed.  ITP-continue with ITP 30 day review New to program  Continue with ITP 30 day review. Continue with ITP Brian Waters felt dizzy after his heart rate was 140 on the Elliptical standing elliptical then he went to a seat piece of equipment with a heart rate of 90 so dizzy and given 2 glasses of water so dizziness was resolved.  30 day review.  Continue with ITP  Doing well with ellipitical, keeping hydrated during exercise.      Comments:

## 2016-05-03 ENCOUNTER — Encounter: Payer: Federal, State, Local not specified - PPO | Admitting: *Deleted

## 2016-05-03 DIAGNOSIS — Z9861 Coronary angioplasty status: Secondary | ICD-10-CM

## 2016-05-03 DIAGNOSIS — I214 Non-ST elevation (NSTEMI) myocardial infarction: Secondary | ICD-10-CM | POA: Diagnosis not present

## 2016-05-03 NOTE — Progress Notes (Signed)
Daily Session Note  Patient Details  Name: Brian Waters MRN: [id number] Date of Birth: November 24, 1960 Referring Provider:        Cardiac Rehab from 04/28/2016 in [facility]   Referring Provider  Kathlyn Sacramento  MD      Encounter Date: 05/03/2016  Check In:     Session Check In - 05/03/16 1613    Check-In   Staff Present Heath Lark, RN, BSN, CCRP;Mary Kellie Shropshire, RN, BSN, [place], BS, ACSM CEP, Exercise Physiologist   Supervising physician immediately available to respond to emergencies See telemetry face sheet for immediately available ER MD   Medication changes reported     No   Fall or balance concerns reported    No   Resistance Training Performed No   VAD Patient? No   Pain Assessment   Currently in Pain? No/denies         Goals Met:  Independence with exercise equipment Exercise tolerated well No report of cardiac concerns or symptoms  Goals Unmet:  Not Applicable  Comments: Doing well with exercise prescription progression.  Discharged today    Dr. Emily Filbert is Medical Director for [name].

## 2016-05-03 NOTE — Progress Notes (Signed)
Cardiac Individual Treatment Plan  Patient Details  Name: Brian Waters MRN: [id number] Date of Birth: 1961/05/30 Referring Provider:        Cardiac Rehab from 04/28/2016 in [facility] Cardiac and Pulmonary Rehab   Referring Provider  Kathlyn Sacramento  MD      Initial Encounter Date:       Cardiac Rehab from 04/28/2016 in [facility] Cardiac and Pulmonary Rehab   Referring Provider  Kathlyn Sacramento  MD      Visit Diagnosis: NSTEMI (non-ST elevated myocardial infarction) ([place])  S/P PTCA (percutaneous transluminal coronary angioplasty)  Patient's Home Medications on Admission:  Current outpatient prescriptions:  .  aspirin EC 81 MG tablet, Take 1 tablet (81 mg total) by mouth daily., Disp: 60 tablet, Rfl:  .  atorvastatin (LIPITOR) 40 MG tablet, Take 1 tablet (40 mg total) by mouth daily at 6 PM., Disp: 60 tablet, Rfl: 1 .  fluticasone (FLONASE) 50 MCG/ACT nasal spray, Place 2 sprays into both nostrils daily. , Disp: , Rfl:  .  loratadine (CLARITIN) 10 MG tablet, Take 10 mg by mouth daily., Disp: , Rfl:  .  metoprolol tartrate (LOPRESSOR) 25 MG tablet, Take 0.5 tablets (12.5 mg total) by mouth 2 (two) times daily. (Patient not taking: Reported on 03/01/2016), Disp: 60 tablet, Rfl: 1 .  Multiple Vitamin (MULTIVITAMIN) tablet, Take 1 tablet by mouth daily., Disp: , Rfl:  .  naproxen sodium (ANAPROX) 220 MG tablet, Take 220 mg by mouth 2 (two) times daily., Disp: , Rfl:  .  ticagrelor (BRILINTA) 90 MG TABS tablet, Take 1 tablet (90 mg total) by mouth 2 (two) times daily., Disp: 60 tablet, Rfl: 1  Past Medical History: Past Medical History  Diagnosis Date  . Seasonal allergies     Tobacco Use: History  Smoking status  . Never Smoker   Smokeless tobacco  . Not on file    Labs: Recent Review Flowsheet Data    There is no flowsheet data to display.       Exercise Target Goals:    Exercise Program Goal: Individual exercise prescription set with THRR, safety & activity barriers.  Participant demonstrates ability to understand and report RPE using BORG scale, to self-measure pulse accurately, and to acknowledge the importance of the exercise prescription.  Exercise Prescription Goal: Starting with aerobic activity 30 plus minutes a day, 3 days per week for initial exercise prescription. Provide home exercise prescription and guidelines that participant acknowledges understanding prior to discharge.  Activity Barriers & Risk Stratification:     Activity Barriers & Cardiac Risk Stratification - 03/01/16 1441    Activity Barriers & Cardiac Risk Stratification   Cardiac Risk Stratification Moderate      6 Minute Walk:     6 Minute Walk      03/01/16 1523 03/01/16 1536 04/21/16 0951   6 Minute Walk   Phase Initial  Discharge   Distance 1970 feet 1970 feet 2165 feet   Distance % Change   9.9 %   Walk Time 6 minutes  6 minutes   # of Rest Breaks   0   MPH 3.7  4.1   METS  35.14 5.98   RPE _0 VO2 Peak   20.91   Symptoms   No   Resting HR 69 bpm 69 bpm 76 bpm   Resting BP 126/64 mmHg 126/64 mmHg 126/78 mmHg   Max Ex. HR 112 bpm 112 bpm 146 bpm   Max Ex. BP 140/76 mmHg  140/76 mmHg 148/70 mmHg      Initial Exercise Prescription:     Initial Exercise Prescription - 04/28/16 0800    Date of Initial Exercise RX and Referring Provider   Referring Provider Kathlyn Sacramento  MD      Perform Capillary Blood Glucose checks as needed.  Exercise Prescription Changes:     Exercise Prescription Changes      03/01/16 1500 03/17/16 0700 04/07/16 1200 04/19/16 0900 04/21/16 1100   Exercise Review   Progression  Yes Yes Yes Yes   Response to Exercise   Blood Pressure (Admit) 126/64 mmHg 138/78 mmHg 120/80 mmHg  126/78 mmHg   Blood Pressure (Exercise) 140/76 mmHg 152/70 mmHg 158/72 mmHg  148/70 mmHg   Blood Pressure (Exit) 126/84 mmHg 132/82 mmHg 120/76 mmHg  122/60 mmHg   Heart Rate (Admit) 76 bpm 85 bpm 82 bpm  76 bpm   Heart Rate (Exercise) 113 bpm  145 bpm  Exercise HR ranged from 99 (REL)-145(AD) 139 bpm  150 bpm   Heart Rate (Exit) 75 bpm 92 bpm 91 bpm  95 bpm   Rating of Perceived Exertion (Exercise)   15  14   Symptoms  none none none none   Comments    Home Exercise Givien 04/19/16 Home Exercise Givien 04/19/16   Duration  Progress to 45 minutes of aerobic exercise without signs/symptoms of physical distress Progress to 30 minutes of continuous aerobic without signs/symptoms of physical distress Progress to 30 minutes of continuous aerobic without signs/symptoms of physical distress Progress to 45 minutes of aerobic exercise without signs/symptoms of physical distress   Intensity  Rest + 40 THRR New  112-148 THRR New  112-148 THRR unchanged   Progression   Progression  Continue progressive overload as per policy without signs/symptoms or physical distress. Continue to progress workloads to maintain intensity without signs/symptoms of physical distress. Continue to progress workloads to maintain intensity without signs/symptoms of physical distress. Continue to progress workloads to maintain intensity without signs/symptoms of physical distress.   Average METs   4.23  treadmill 4.23  treadmill 6.8   Resistance Training   Training Prescription  Yes Yes Yes Yes   Weight  3 4-5 lbs  4 left hand, 5 right hand 4-5 lbs  4 left hand, 5 right hand 4-5 lbs   Reps  10-15  Pt recently had left shoulder surgery.   10-15 10-15 10-15   Interval Training   Interval Training   No Yes Yes   Equipment    Treadmill;Elliptical Treadmill;Elliptical   Comments    2 min 30 sec 2 min 30 sec   Treadmill   MPH  3 3.2 3.2 3.3   Grade  _0 Minutes  _1 Bike   Level  1.6  AD bike  1.6 1.6    Minutes  _2 Elliptical   Level   _3 Speed   4.5 4.5 4.8   Minutes   _4 REL-XR   Level  _5 Watts  80 99 99    Minutes  _6 Home Exercise Plan   Plans to continue exercise at    Home  walking and elliptical  Home  walking and elliptical   Frequency    Add 3 additional days to program exercise sessions. Add 3 additional days to program exercise sessions.  Exercise Comments:     Exercise Comments      03/03/16 0843 03/17/16 0718 03/17/16 0719 04/07/16 1241 04/19/16 0904   Exercise Comments First day of exercise!Marden Noble was oriented to the gym and the equipment functions and settings. Procedures and policies of the gym were outlined and explained. The patient's individual exercise prescription and treatment plan were reviewed with him. All starting workloads were established based on the results of the functional testing  done at the initial intake visit. The plan for exercise progression was also introduced and progression will be customized based on the patient's performance and goals.  Reviewed individualized exercise prescription and made increases per departmental policy. Exercise increases were discussed with the patient and they were able to perform the new work loads without issue (no signs or symptoms).  Marden Noble has gotten off to a great start with his exercise program. He has tolerated his exercise well and has shown increases in his strengh and stamina by increasing his intensities and duration of exercise. His THRR was increased to rest +40 to accomodate his ability to challenge himself with exercise.  Marden Noble has continued to make progress with exercise.  He is feeling more challenged during exercise.  We will discuss adding in interval training at next exercise session.  He enjoys the challenge on the ellipitcal. Reviewed home exercise with pt today.  Pt plans to walk with dog and use elliptical at home for exercise.  Reviewed THR, pulse, RPE, sign and symptoms, and when to call 911 or MD.  Also discussed weather considerations and indoor options.  Pt voiced understanding.     04/21/16 0953           Exercise Comments [name] completed his post 6 min walk test today.  He made a 9.9% improvement  (195 ft).  He is doing great with his exercise.  He is enjoyoing interval training.          Discharge Exercise Prescription (Final Exercise Prescription Changes):     Exercise Prescription Changes - 04/21/16 1100    Exercise Review   Progression Yes   Response to Exercise   Blood Pressure (Admit) 126/78 mmHg   Blood Pressure (Exercise) 148/70 mmHg   Blood Pressure (Exit) 122/60 mmHg   Heart Rate (Admit) 76 bpm   Heart Rate (Exercise) 150 bpm   Heart Rate (Exit) 95 bpm   Rating of Perceived Exertion (Exercise) 14   Symptoms none   Comments Home Exercise Givien 04/19/16   Duration Progress to 45 minutes of aerobic exercise without signs/symptoms of physical distress   Intensity THRR unchanged   Progression   Progression Continue to progress workloads to maintain intensity without signs/symptoms of physical distress.   Average METs 6.8   Resistance Training   Training Prescription Yes   Weight 4-5 lbs   Reps 10-15   Interval Training   Interval Training Yes   Equipment Treadmill;Elliptical   Comments 2 min 30 sec   Treadmill   MPH 3.3   Grade 8   Minutes 15   Elliptical   Level 9   Speed 4.8   Minutes 15   Home Exercise Plan   Plans to continue exercise at Home  walking and elliptical   Frequency Add 3 additional days to program exercise sessions.      Nutrition:  Target Goals: Understanding of nutrition guidelines, daily intake of sodium <1538m, cholesterol <2064m calories 30% from fat and 7% or less from saturated fats, daily to have 5  or more servings of fruits and vegetables.  Biometrics:     Pre Biometrics - 03/01/16 1524    Pre Biometrics   Height 5' 10" (1.778 m)   Weight 195 lb 1.6 oz (88.497 kg)   Waist Circumference 40.25 inches   Hip Circumference 43.5 inches   Waist to Hip Ratio 0.93 %   BMI (Calculated) 28.1       Nutrition Therapy Plan and Nutrition Goals:     Nutrition Therapy & Goals - 04/02/16 1056    Nutrition Therapy   Diet  Instructed on Heart Healthy dietary guidelines including DASH diet principles.   Drug/Food Interactions Statins/Certain Fruits   Protein (specify units) 8   Fiber 30 grams   Whole Grain Foods 3 servings   Saturated Fats 13 max. grams  trans fat-0   Fruits and Vegetables 5 servings/day   Sodium 1500 grams   Personal Nutrition Goals   Personal Goal #1 Read labels for saturated fat, trans fat and sodium.   Personal Goal #2 Include at least 3 servings of whole grains daily.   Intervention Plan   Intervention Prescribe, educate and counsel regarding individualized specific dietary modifications aiming towards targeted core components such as weight, hypertension, lipid management, diabetes, heart failure and other comorbidities.;Nutrition handout(s) given to patient.   Expected Outcomes Short Term Goal: Understand basic principles of dietary content, such as calories, fat, sodium, cholesterol and nutrients.;Short Term Goal: A plan has been developed with personal nutrition goals set during dietitian appointment.;Long Term Goal: Adherence to prescribed nutrition plan.      Nutrition Discharge: Rate Your Plate Scores:     Nutrition Assessments - 04/28/16 1015    Rate Your Plate Scores   Post Score 84   Post Score % 93 %      Nutrition Goals Re-Evaluation:   Psychosocial: Target Goals: Acknowledge presence or absence of depression, maximize coping skills, provide positive support system. Participant is able to verbalize types and ability to use techniques and skills needed for reducing stress and depression.  Initial Review & Psychosocial Screening:     Initial Psych Review & Screening - 03/01/16 1437    Initial Review   Current issues with Current Stress Concerns  Stress issiues with work and family. Overwhelmed with current medical diagnosis.  Has stated  he took time off this week and that time away form work has helped reduce the stress feelings.    Source of Stress Concerns  Occupation   Comments One child in college and one in high school.   Another graduated from college.    Family Dynamics   Good Support System? Yes  Wife and children   Barriers   Psychosocial barriers to participate in program There are no identifiable barriers or psychosocial needs.;The patient should benefit from training in stress management and relaxation.   Screening Interventions   Interventions Encouraged to exercise      Quality of Life Scores:     Quality of Life - 04/28/16 1012    Quality of Life Scores   Health/Function Post 21.83 %   Socioeconomic Post 22 %   Psych/Spiritual Post 20.07 %   Family Post 27.6 %   GLOBAL Post 22.35 %      PHQ-9:     Recent Review Flowsheet Data    Depression screen [facility] 2/9 04/28/2016 03/01/2016   Decreased Interest - 1   Down, Depressed, Hopeless 1 1   PHQ - 2 Score 1 2   Altered sleeping 1  1   Tired, decreased energy 1 1   Change in appetite 0 1   Feeling bad or failure about yourself  0 1   Trouble concentrating 0 1   Moving slowly or fidgety/restless 0 1   Suicidal thoughts 0 0   PHQ-9 Score 3 8   Difficult doing work/chores Not difficult at all Somewhat difficult       Psychosocial Evaluation and Intervention:     Psychosocial Evaluation - 03/03/16 1005    Psychosocial Evaluation & Interventions   Interventions Stress management education;Relaxation education;Encouraged to exercise with the program and follow exercise prescription   Comments Counselor met with Mr. Goheen today for initial psychosocial evaluation.  He is a 55 year old who has a strong family history of heart disease and recently had an incident himself.  He has a strong support system with a spouse of 63 years and several adult children.  He is also actively involved in his local church community.  Mr. Bunte had shoulder surgery in January so now he is recovering from this as well.  He denies sleeping well (maybe interrupted 5-6 hours per night) and  denies a history of depression.  Although he admits to some anxiety symptoms currently with job stress in particular contributing to this.  He states his mood is generally positive.  Mr. Schaumburg has goals to be educated on his limitations of exercise and to increase his stamina and strength while in this program.  Counselor made some recommendations for stress management with deep breathing practiced and consistent exercise.  Counselor also suggested checking with his doctor or pharmacist to see if an OTC sleep aid might be helpful during this time.     Continued Psychosocial Services Needed Yes  Mr. Enyeart is recommended to practice deep breathing and consistent exercise to help with stress management.  In addition, he will benefit from that psychoeducational component of this program.  Counselor will follow with him re: these suggestions.       Psychosocial Re-Evaluation:     Psychosocial Re-Evaluation      04/21/16 0933 04/26/16 0958         Psychosocial Re-Evaluation   Comments Counselor follow up with Mr. Cafiero today reporting feeling stronger now as he heads towards completion of this program.  He reports he is sleeping better and more consistently and he is feeling more stamina and strength since beginning this program.  Mr. Wlodarczyk also is practicing better stress management skills as he reports setting better limits at work; delegating tasks; and learning to say "no" more often to others - especially at church.  Counselor commended Mr. Weirauch for his hard work and positive outcomes so far.  He will be finishing up soon and  plans to consistently work out on his elliptical at home to maintain this progress.    Counselor follow up with Mr. Fails today as he reported struggling with relaxation techniques and stress from his job interfering with this process.  Counselor shared some mindfulness techniques with Mr. Delane Ginger and encouraged him to practice.  By the end of class, he reported trying  this with some success already experienced.  Counselor will follow with him re: this in the future.           Vocational Rehabilitation: Provide vocational rehab assistance to qualifying candidates.   Vocational Rehab Evaluation & Intervention:     Vocational Rehab - 03/01/16 1401    Initial Vocational Rehab Evaluation & Intervention   Assessment shows  need for Vocational Rehabilitation No      Education: Education Goals: Education classes will be provided on a weekly basis, covering required topics. Participant will state understanding/return demonstration of topics presented.  Learning Barriers/Preferences:     Learning Barriers/Preferences - 03/01/16 1441    Learning Barriers/Preferences   Learning Barriers None   Learning Preferences Computer/Internet;Individual Instruction;Video      Education Topics: General Nutrition Guidelines/Fats and Fiber: -Group instruction provided by verbal, written material, models and posters to present the general guidelines for heart healthy nutrition. Gives an explanation and review of dietary fats and fiber.          Cardiac Rehab from 05/03/2016 in [facility] Cardiac and Pulmonary Rehab   Date  04/12/16   Educator  Jill Side   Instruction Review Code  2- meets goals/outcomes      Controlling Sodium/Reading Food Labels: -Group verbal and written material supporting the discussion of sodium use in heart healthy nutrition. Review and explanation with models, verbal and written materials for utilization of the food label.      Cardiac Rehab from 05/03/2016 in [facility] Cardiac and Pulmonary Rehab   Date  04/19/16   Educator  CR   Instruction Review Code  2- meets goals/outcomes      Exercise Physiology & Risk Factors: - Group verbal and written instruction with models to review the exercise physiology of the cardiovascular system and associated critical values. Details cardiovascular disease risk factors and the goals associated with each risk  factor.      Cardiac Rehab from 05/03/2016 in [facility] Cardiac and Pulmonary Rehab   Date  04/21/16   Educator  [facility]   Instruction Review Code  2- meets goals/outcomes      Aerobic Exercise & Resistance Training: - Gives group verbal and written discussion on the health impact of inactivity. On the components of aerobic and resistive training programs and the benefits of this training and how to safely progress through these programs.      Cardiac Rehab from 05/03/2016 in [facility] Cardiac and Pulmonary Rehab   Date  04/26/16   Educator  [facility]   Instruction Review Code  2- meets goals/outcomes      Flexibility, Balance, General Exercise Guidelines: - Provides group verbal and written instruction on the benefits of flexibility and balance training programs. Provides general exercise guidelines with specific guidelines to those with heart or lung disease. Demonstration and skill practice provided.      Cardiac Rehab from 05/03/2016 in [facility] Cardiac and Pulmonary Rehab   Date  04/28/16   Educator  AS   Instruction Review Code  R- Review/reinforce [Second Class]      Stress Management: - Provides group verbal and written instruction about the health risks of elevated stress, cause of high stress, and healthy ways to reduce stress.      Cardiac Rehab from 05/03/2016 in [facility] Cardiac and Pulmonary Rehab   Date  03/10/16   Educator  [facility]   Instruction Review Code  2- meets goals/outcomes      Depression: - Provides group verbal and written instruction on the correlation between heart/lung disease and depressed mood, treatment options, and the stigmas associated with seeking treatment.      Cardiac Rehab from 05/03/2016 in [facility] Cardiac and Pulmonary Rehab   Date  04/07/16   Educator  [facility]   Instruction Review Code  2- meets goals/outcomes      Anatomy & Physiology of the Heart: - Group verbal and written instruction and  models provide basic cardiac anatomy and physiology, with the coronary electrical and  arterial systems. Review of: AMI, Angina, Valve disease, Heart Failure, Cardiac Arrhythmia, Pacemakers, and the ICD.      Cardiac Rehab from 05/03/2016 in [facility] Cardiac and Pulmonary Rehab   Date  05/03/16   Educator  SB   Instruction Review Code  2- meets goals/outcomes      Cardiac Procedures: - Group verbal and written instruction and models to describe the testing methods done to diagnose heart disease. Reviews the outcomes of the test results. Describes the treatment choices: Medical Management, Angioplasty, or Coronary Bypass Surgery.      Cardiac Rehab from 05/03/2016 in [facility] Cardiac and Pulmonary Rehab   Date  03/15/16   Educator  CE   Instruction Review Code  2- meets goals/outcomes      Cardiac Medications: - Group verbal and written instruction to review commonly prescribed medications for heart disease. Reviews the medication, class of the drug, and side effects. Includes the steps to properly store meds and maintain the prescription regimen.   Go Sex-Intimacy & Heart Disease, Get SMART - Goal Setting: - Group verbal and written instruction through game format to discuss heart disease and the return to sexual intimacy. Provides group verbal and written material to discuss and apply goal setting through the application of the S.M.A.R.T. Method.      Cardiac Rehab from 05/03/2016 in [facility] Cardiac and Pulmonary Rehab   Date  03/15/16   Educator  CE   Instruction Review Code  2- meets goals/outcomes      Other Matters of the Heart: - Provides group verbal, written materials and models to describe Heart Failure, Angina, Valve Disease, and Diabetes in the realm of heart disease. Includes description of the disease process and treatment options available to the cardiac patient.      Cardiac Rehab from 05/03/2016 in [facility] Cardiac and Pulmonary Rehab   Date  05/03/16   Educator  SB   Instruction Review Code  2- meets goals/outcomes      Exercise & Equipment Safety: - Individual  verbal instruction and demonstration of equipment use and safety with use of the equipment.      Cardiac Rehab from 05/03/2016 in [facility] Cardiac and Pulmonary Rehab   Date  03/01/16   Educator  SB   Instruction Review Code  2- meets goals/outcomes      Infection Prevention: - Provides verbal and written material to individual with discussion of infection control including proper hand washing and proper equipment cleaning during exercise session.      Cardiac Rehab from 05/03/2016 in [facility] Cardiac and Pulmonary Rehab   Date  03/01/16   Educator  SB   Instruction Review Code  2- meets goals/outcomes      Falls Prevention: - Provides verbal and written material to individual with discussion of falls prevention and safety.      Cardiac Rehab from 05/03/2016 in [facility] Cardiac and Pulmonary Rehab   Date  03/01/16   Educator  SB   Instruction Review Code  2- meets goals/outcomes      Diabetes: - Individual verbal and written instruction to review signs/symptoms of diabetes, desired ranges of glucose level fasting, after meals and with exercise. Advice that pre and post exercise glucose checks will be done for 3 sessions at entry of program.    Knowledge Questionnaire Score:     Knowledge Questionnaire Score - 04/28/16 1016    Knowledge Questionnaire Score   Post  Score 26      Core Components/Risk Factors/Patient Goals at Admission:     Personal Goals and Risk Factors at Admission - 03/01/16 1435    Core Components/Risk Factors/Patient Goals on Admission   Sedentary Yes   Intervention Provide advice, education, support and counseling about physical activity/exercise needs.;Develop an individualized exercise prescription for aerobic and resistive training based on initial evaluation findings, risk stratification, comorbidities and participant's personal goals.   Expected Outcomes Achievement of increased cardiorespiratory fitness and enhanced flexibility, muscular endurance and strength  shown through measurements of functional capacity and personal statement of participant.   Stress Yes  Stress more work related.  Marden Noble has talked to his boss about making changes to reduce the work stress.   Intervention Offer individual and/or small group education and counseling on adjustment to heart disease, stress management and health-related lifestyle change. Teach and support self-help strategies.;Refer participants experiencing significant psychosocial distress to appropriate mental health specialists for further evaluation and treatment. When possible, include family members and significant others in education/counseling sessions.   Expected Outcomes Short Term: Participant demonstrates changes in health-related behavior, relaxation and other stress management skills, ability to obtain effective social support, and compliance with psychotropic medications if prescribed.;Long Term: Emotional wellbeing is indicated by absence of clinically significant psychosocial distress or social isolation.      Core Components/Risk Factors/Patient Goals Review:      Goals and Risk Factor Review      04/23/16 0802 04/30/16 1019         Core Components/Risk Factors/Patient Goals Review   Personal Goals Review Sedentary;Stress;Lipids Sedentary      Review Marden Noble has been doing great with exercise here and at home.  He is using an ellilptical at home and walking with dog.  He is also planning to join gym after graduation.  He has an appointment next week to check new lipids.  His stress levels have significantly decreased as he is now saying no to things at work.  (see pyschosocial note)  Marden Noble does great on the Elliptical and treadmill.       Expected Outcomes We will continue to work with Marden Noble to see continued improvements.          Core Components/Risk Factors/Patient Goals at Discharge (Final Review):      Goals and Risk Factor Review - 04/30/16 1019    Core Components/Risk Factors/Patient Goals  Review   Personal Goals Review Sedentary   Review Marden Noble does great on the Elliptical and treadmill.       ITP Comments:     ITP Comments      03/01/16 1359 03/03/16 0657 03/28/16 1252 04/02/16 0935 04/28/16 0732   ITP Comments inital med review completed.  ITP-continue with ITP 30 day review New to program  Continue with ITP 30 day review. Continue with ITP [name] felt dizzy after his heart rate was 140 on the Elliptical standing elliptical then he went to a seat piece of equipment with a heart rate of 90 so dizzy and given 2 glasses of water so dizziness was resolved.  30 day review.  Continue with ITP  Doing well with ellipitical, keeping hydrated during exercise.     05/03/16 1615           ITP Comments discharged today.  Will exercise at home          Comments:

## 2016-05-03 NOTE — Progress Notes (Signed)
Discharge Summary  Patient Details  Name: Brian Waters MRN: [id number] Date of Birth: [birth year] Referring Provider:        [facility]   Referring Provider  Brian BearsArida, [name]  MD       Number of Visits: 36   Reason for Discharge:  Patient reached a stable level of exercise. Patient independent in their exercise.  Smoking History:  History  Smoking status  . Never Smoker   Smokeless tobacco  . Not on file    Diagnosis:  NSTEMI (non-ST elevated myocardial infarction) (HCC)  S/P PTCA (percutaneous transluminal coronary angioplasty)  ADL UCSD:   Initial Exercise Prescription:     Initial Exercise Prescription - 04/28/16 0800    Date of Initial Exercise RX and Referring Provider   Referring Provider Brian BearsArida, [name]  MD      Discharge Exercise Prescription (Final Exercise Prescription Changes):     Exercise Prescription Changes - 04/21/16 1100    Exercise Review   Progression Yes   Response to Exercise   Blood Pressure (Admit) 126/78 mmHg   Blood Pressure (Exercise) 148/70 mmHg   Blood Pressure (Exit) 122/60 mmHg   Heart Rate (Admit) 76 bpm   Heart Rate (Exercise) 150 bpm   Heart Rate (Exit) 95 bpm   Rating of Perceived Exertion (Exercise) 14   Symptoms none   Comments Home Exercise Givien 04/19/16   Duration Progress to 45 minutes of aerobic exercise without signs/symptoms of physical distress   Intensity THRR unchanged   Progression   Progression Continue to progress workloads to maintain intensity without signs/symptoms of physical distress.   Average METs 6.8   Resistance Training   Training Prescription Yes   Weight 4-5 lbs   Reps 10-15   Interval Training   Interval Training Yes   Equipment Treadmill;Elliptical   Comments 2 min 30 sec   Treadmill   MPH 3.3   Grade 8   Minutes 15   Elliptical   Level 9   Speed 4.8   Minutes 15   Home Exercise Plan   Plans to continue exercise at Home   walking and elliptical   Frequency Add 3 additional days to program exercise sessions.      Functional Capacity:     6 Minute Walk      03/01/16 1523 03/01/16 1536 04/21/16 0951   6 Minute Walk   Phase Initial  Discharge   Distance 1970 feet 1970 feet 2165 feet   Distance % Change   9.9 %   Walk Time 6 minutes  6 minutes   # of Rest Breaks   0   MPH 3.7  4.1   METS  35.14 5.98   RPE 13 13 15    VO2 Peak   20.91   Symptoms   No   Resting HR 69 bpm 69 bpm 76 bpm   Resting BP 126/64 mmHg 126/64 mmHg 126/78 mmHg   Max Ex. HR 112 bpm 112 bpm 146 bpm   Max Ex. BP 140/76 mmHg 140/76 mmHg 148/70 mmHg      Psychological, QOL, Others - Outcomes: PHQ 2/9: [facility] 04/28/2016 03/01/2016  Decreased Interest - 1  Down, Depressed, Hopeless 1 1  PHQ - 2 Score 1 2  Altered sleeping 1 1  Tired, decreased energy 1 1  Change in appetite 0 1  Feeling bad or failure about yourself  0 1  Trouble concentrating 0 1  Moving  slowly or fidgety/restless 0 1  Suicidal thoughts 0 0  PHQ-9 Score 3 8  Difficult doing work/chores Not difficult at all Somewhat difficult    Quality of Life:     Quality of Life - 04/28/16 1012    Quality of Life Scores   Health/Function Post 21.83 %   Socioeconomic Post 22 %   Psych/Spiritual Post 20.07 %   Family Post 27.6 %   GLOBAL Post 22.35 %      Personal Goals: Goals established at orientation with interventions provided to work toward goal.     Personal Goals and Risk Factors at Admission - 03/01/16 1435    Core Components/Risk Factors/Patient Goals on Admission   Sedentary Yes   Intervention Provide advice, education, support and counseling about physical activity/exercise needs.;Develop an individualized exercise prescription for aerobic and resistive training based on initial evaluation findings, risk stratification, comorbidities and participant's personal goals.   Expected Outcomes Achievement of increased cardiorespiratory  fitness and enhanced flexibility, muscular endurance and strength shown through measurements of functional capacity and personal statement of participant.   Stress Yes  Stress more work related.  Brian RomneyDoug has talked to his boss about making changes to reduce the work stress.   Intervention Offer individual and/or small group education and counseling on adjustment to heart disease, stress management and health-related lifestyle change. Teach and support self-help strategies.;Refer participants experiencing significant psychosocial distress to appropriate mental health specialists for further evaluation and treatment. When possible, include family members and significant others in education/counseling sessions.   Expected Outcomes Short Term: Participant demonstrates changes in health-related behavior, relaxation and other stress management skills, ability to obtain effective social support, and compliance with psychotropic medications if prescribed.;Long Term: Emotional wellbeing is indicated by absence of clinically significant psychosocial distress or social isolation.       Personal Goals Discharge:     Goals and Risk Factor Review      04/23/16 0802 04/30/16 1019         Core Components/Risk Factors/Patient Goals Review   Personal Goals Review Sedentary;Stress;Lipids Sedentary      Review Brian RomneyDoug has been doing great with exercise here and at home.  He is using an ellilptical at home and walking with dog.  He is also planning to join gym after graduation.  He has an appointment next week to check new lipids.  His stress levels have significantly decreased as he is now saying no to things at work.  (see pyschosocial note)  Brian RomneyDoug does great on the Elliptical and treadmill.       Expected Outcomes We will continue to work with Brian Romneyoug to see continued improvements.          Nutrition & Weight - Outcomes:     Pre Biometrics - 03/01/16 1524    Pre Biometrics   Height 5\' 10"  (1.778 m)   Weight 195 lb  1.6 oz (88.497 kg)   Waist Circumference 40.25 inches   Hip Circumference 43.5 inches   Waist to Hip Ratio 0.93 %   BMI (Calculated) 28.1       Nutrition:     Nutrition Therapy & Goals - 04/02/16 1056    Nutrition Therapy   Diet Instructed on Heart Healthy dietary guidelines including DASH diet principles.   Drug/Food Interactions Statins/Certain Fruits   Protein (specify units) 8   Fiber 30 grams   Whole Grain Foods 3 servings   Saturated Fats 13 max. grams  trans fat-0   Fruits and Vegetables 5  servings/day   Sodium 1500 grams   Personal Nutrition Goals   Personal Goal #1 Read labels for saturated fat, trans fat and sodium.   Personal Goal #2 Include at least 3 servings of whole grains daily.   Intervention Plan   Intervention Prescribe, educate and counsel regarding individualized specific dietary modifications aiming towards targeted core components such as weight, hypertension, lipid management, diabetes, heart failure and other comorbidities.;Nutrition handout(s) given to patient.   Expected Outcomes Short Term Goal: Understand basic principles of dietary content, such as calories, fat, sodium, cholesterol and nutrients.;Short Term Goal: A plan has been developed with personal nutrition goals set during dietitian appointment.;Long Term Goal: Adherence to prescribed nutrition plan.      Nutrition Discharge:     Nutrition Assessments - 04/28/16 1015    Rate Your Plate Scores   Post Score 84   Post Score % 93 %      Education Questionnaire Score:     Knowledge Questionnaire Score - 04/28/16 1016    Knowledge Questionnaire Score   Post Score 26      Goals reviewed with patient; copy given to patient.

## 2018-03-30 ENCOUNTER — Ambulatory Visit
Admission: EM | Admit: 2018-03-30 | Discharge: 2018-03-30 | Disposition: A | Discharge status: Home / Self Care | Payer: Federal, State, Local not specified - PPO | Attending: Emergency Medicine | Admitting: Emergency Medicine

## 2018-03-30 ENCOUNTER — Other Ambulatory Visit: Payer: Self-pay

## 2018-03-30 ENCOUNTER — Encounter: Payer: Self-pay | Admitting: Emergency Medicine

## 2018-03-30 DIAGNOSIS — S30860A Insect bite (nonvenomous) of lower back and pelvis, initial encounter: Secondary | ICD-10-CM

## 2018-03-30 DIAGNOSIS — W57XXXA Bitten or stung by nonvenomous insect and other nonvenomous arthropods, initial encounter: Secondary | ICD-10-CM | POA: Diagnosis not present

## 2018-03-30 MED ORDER — DOXYCYCLINE HYCLATE 100 MG PO CAPS: 100.0000 mg | ORAL_CAPSULE | Freq: Two times a day (BID) | ORAL | 0 refills | Status: DC

## 2018-03-30 NOTE — Discharge Instructions (Addendum)
Apply abx ointment to affected area, mark calendar, you are being treated for tick bite prophylaxis. Follow up with PCP for recheck. Avoid sun as doxycycline will make you more light sensitive/cause rash. Return to UC as needed.

## 2018-03-30 NOTE — ED Triage Notes (Signed)
Patient c/o tick bite to his left hip on Tuesday.  Patient c/o redness, swelling and tenderness at the site.

## 2018-03-30 NOTE — ED Provider Notes (Signed)
[facility]    CSN: [id number] Arrival date & time: 03/30/18  4132     History   Chief Complaint Chief Complaint  Patient presents with  . Insect Bite    Tick    HPI Brian Waters is a 57 y.o. male.   57 yr old male pt presents to UC w cc of tick bite to left lower back, removed 2 days ago now with erythema/itching at area of bite. Pt requesting doxycycline for tick bite prophylaxis.   The history is provided by the patient. No language interpreter was used.    Past Medical History:  Diagnosis Date  . Seasonal allergies     Patient Active Problem List   Diagnosis Date Noted  . 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 Arteriosclerosis of coronary artery 02/18/2016  . NSTEMI (non-ST elevated myocardial infarction) (HCC) 02/02/2016  . Chest pain 01/31/2016  . AC (acromioclavicular) arthritis 10/23/2015  . Allergic rhinitis 07/24/2014    Past Surgical History:  Procedure Laterality Date  . CARDIAC CATHETERIZATION Bilateral 02/02/2016   Procedure: Left Heart Cath and Coronary Angiography;  Surgeon: Dalia Heading, MD;  Location: [facility];  Service: Cardiovascular;  Laterality: Bilateral;  . CARDIAC CATHETERIZATION N/A 02/02/2016   Procedure: Coronary Stent Intervention;  Surgeon: Alwyn Pea, MD;  Location: [facility];  Service: Cardiovascular;  Laterality: N/A;  . CYST EXCISION    . HERNIA REPAIR    . SHOULDER ARTHROSCOPY         Home Medications    Prior to Admission medications   Medication Sig Start Date End Date Taking? Authorizing Provider  aspirin EC 81 MG tablet Take 1 tablet (81 mg total) by mouth daily. 02/03/16  Yes Houston Siren, MD  atorvastatin (LIPITOR) 40 MG tablet Take 1 tablet (40 mg total) by mouth daily at 6 PM. 02/03/16  Yes Sainani, Rolly Pancake, MD  fluticasone (FLONASE) 50 MCG/ACT nasal spray Place 2 sprays into both nostrils daily.    Yes [provider]  loratadine  (CLARITIN) 10 MG tablet Take 10 mg by mouth daily.   Yes [provider]  doxycycline (VIBRAMYCIN) 100 MG capsule Take 1 capsule (100 mg total) by mouth 2 (two) times daily. 03/30/18   Kobey Sides, Para March, NP  metoprolol tartrate (LOPRESSOR) 25 MG tablet Take 0.5 tablets (12.5 mg total) by mouth 2 (two) times daily. Patient not taking: Reported on 03/01/2016 02/03/16   Houston Siren, MD  Multiple Vitamin (MULTIVITAMIN) tablet Take 1 tablet by mouth daily.    [provider]  naproxen sodium (ANAPROX) 220 MG tablet Take 220 mg by mouth 2 (two) times daily.    [provider]  ticagrelor (BRILINTA) 90 MG TABS tablet Take 1 tablet (90 mg total) by mouth 2 (two) times daily. 02/03/16   Houston Siren, MD    Family History Family History  Problem Relation Age of Onset  . CAD Father     Social History Social History   Tobacco Use  . Smoking status: Never Smoker  . Smokeless tobacco: Never Used  Substance Use Topics  . Alcohol use: Yes  . Drug use: Never     Allergies   Ibuprofen; Codeine; and Penicillins   Review of Systems Review of Systems  Skin: Positive for color change and wound.  All other systems reviewed and are negative.    Physical Exam Triage Vital Signs ED Triage Vitals  Enc Vitals Group  BP 03/30/18 0852 129/89     Pulse Rate 03/30/18 0852 76     Resp 03/30/18 0852 16     Temp 03/30/18 0852 98 F (36.7 C)     Temp Source 03/30/18 0852 Oral     SpO2 03/30/18 0852 99 %     Weight 03/30/18 0849 200 lb (90.7 kg)     Height 03/30/18 0849  (1.753 m)     Head Circumference --      Peak Flow --      Pain Score 03/30/18 0849 1     Pain Loc --      Pain Edu? --      Excl. in GC? --    No data found.  Updated Vital Signs BP 129/89 (BP Location: Left Arm)   Pulse 76   Temp 98 F (36.7 C) (Oral)   Resp 16   Ht  (1.753 m)   Wt 200 lb (90.7 kg)   SpO2 99%   BMI 29.53 kg/m    Physical Exam  Constitutional: He is  oriented to person, place, and time. He appears well-developed and well-nourished. He is active and cooperative.  Non-toxic appearance. He has a sickly appearance. He does not appear ill. No distress.  HENT:  Head: Normocephalic.  Right Ear: Tympanic membrane normal.  Left Ear: Tympanic membrane normal.  Nose: Nose normal.  Mouth/Throat: Uvula is midline, oropharynx is clear and moist and mucous membranes are normal. Mucous membranes are not dry.  Eyes: Pupils are equal, round, and reactive to light. Conjunctivae, EOM and lids are normal.  Neck: Trachea normal and normal range of motion.  Cardiovascular: Normal rate, regular rhythm, normal heart sounds and normal pulses.  Pulmonary/Chest: Effort normal and breath sounds normal.  Abdominal: Soft. Normal appearance. Bowel sounds are increased. There is no tenderness. There is no rebound and no guarding. No hernia.  Musculoskeletal: Normal range of motion.  Neurological: He is alert and oriented to person, place, and time. No cranial nerve deficit or sensory deficit. GCS eye subscore is 4. GCS verbal subscore is 5. GCS motor subscore is 6.  Skin: Skin is warm and dry. Capillary refill takes less than 2 seconds. Lesion noted. No rash noted. There is erythema.     + erythema mild around site of insect bite, no bullseye appearance, no streaking, no purulent drainage.   Psychiatric: He has a normal mood and affect. His speech is normal and behavior is normal.  Nursing note and vitals reviewed.    UC Treatments / Results  Labs (all labs ordered are listed, but only abnormal results are displayed) Labs Reviewed - No data to display  EKG None  Radiology No results found.  Procedures Procedures (including critical care time)  Medications Ordered in UC Medications - No data to display  Initial Impression / Assessment and Plan / UC Course  I have reviewed the triage vital signs and the nursing notes.  Pertinent labs & imaging results  that were available during my care of the patient were reviewed by me and considered in my medical decision making (see chart for details).    Apply abx ointment to affected area, mark calendar, you are being treated for tick bite prophylaxis. Follow up with PCP for recheck. Avoid sun as doxycycline will make you more light sensitive/cause rash. Return to UC as needed.   Final Clinical Impressions(s) / UC Diagnoses   Final diagnoses:  Tick bite, initial encounter     Discharge  Instructions     Apply abx ointment to affected area, mark calendar, you are being treated for tick bite prophylaxis. Follow up with PCP for recheck. Avoid sun as doxycycline will make you more light sensitive/cause rash. Return to UC as needed.    ED Prescriptions    Medication Sig Dispense Auth. Provider   doxycycline (VIBRAMYCIN) 100 MG capsule Take 1 capsule (100 mg total) by mouth 2 (two) times daily. 20 capsule Nishi Neiswonger, Para March, NP     Controlled Substance Prescriptions    Clancy Gourd, NP 03/30/18 [phone]

## 2019-07-24 DIAGNOSIS — I10 Essential (primary) hypertension: Secondary | ICD-10-CM | POA: Insufficient documentation

## 2020-04-13 ENCOUNTER — Ambulatory Visit
Admission: EM | Admit: 2020-04-13 | Discharge: 2020-04-13 | Disposition: A | Discharge status: Home / Self Care | Payer: Federal, State, Local not specified - PPO | Attending: Family Medicine | Admitting: Family Medicine

## 2020-04-13 ENCOUNTER — Other Ambulatory Visit: Payer: Self-pay

## 2020-04-13 DIAGNOSIS — H6503 Acute serous otitis media, bilateral: Secondary | ICD-10-CM | POA: Diagnosis not present

## 2020-04-13 DIAGNOSIS — J0100 Acute maxillary sinusitis, unspecified: Secondary | ICD-10-CM

## 2020-04-13 MED ORDER — SULFAMETHOXAZOLE-TRIMETHOPRIM 800-160 MG PO TABS: 1.0000 | ORAL_TABLET | Freq: Two times a day (BID) | ORAL | 0 refills | Status: DC

## 2020-04-13 NOTE — ED Provider Notes (Signed)
[facility]    CSN: [id number] Arrival date & time: 04/13/20  1504      History   Chief Complaint Chief Complaint  Patient presents with  . Nasal Congestion    HPI Brian Waters is a 59 y.o. male.   59 yo male with a c/o nasal congestion, sinus pressure, dizziness, bilateral ear pressure and slight cough for 3 weeks. Symptoms worse in the morning. Has been taking over the counter medications and nose sprays. Denies any fevers, chills, chest pains, shortness of breath.     Past Medical History:  Diagnosis Date  . Seasonal allergies     Patient Active Problem List   Diagnosis Date Noted  . 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 Arteriosclerosis of coronary artery 02/18/2016  . NSTEMI (non-ST elevated myocardial infarction) (HCC) 02/02/2016  . Chest pain 01/31/2016  . AC (acromioclavicular) arthritis 10/23/2015  . Allergic rhinitis 07/24/2014    Past Surgical History:  Procedure Laterality Date  . CARDIAC CATHETERIZATION Bilateral 02/02/2016   Procedure: Left Heart Cath and Coronary Angiography;  Surgeon: Dalia Heading, MD;  Location: [facility];  Service: Cardiovascular;  Laterality: Bilateral;  . CARDIAC CATHETERIZATION N/A 02/02/2016   Procedure: Coronary Stent Intervention;  Surgeon: Alwyn Pea, MD;  Location: [facility];  Service: Cardiovascular;  Laterality: N/A;  . CYST EXCISION    . HERNIA REPAIR    . SHOULDER ARTHROSCOPY         Home Medications    Prior to Admission medications   Medication Sig Start Date End Date Taking? Authorizing Provider  aspirin EC 81 MG tablet Take 1 tablet (81 mg total) by mouth daily. 02/03/16  Yes Houston Siren, MD  atorvastatin (LIPITOR) 40 MG tablet Take 1 tablet (40 mg total) by mouth daily at 6 PM. 02/03/16  Yes Sainani, Rolly Pancake, MD  Cholecalciferol 50 MCG (2000 UT) TABS Take by mouth.   Yes [provider]  fluticasone (FLONASE) 50 MCG/ACT  nasal spray Place 2 sprays into both nostrils daily.    Yes [provider]  loratadine (CLARITIN) 10 MG tablet Take 10 mg by mouth daily.   Yes [provider]  Multiple Vitamin (MULTIVITAMIN) tablet Take 1 tablet by mouth daily.   Yes [provider]  valsartan (DIOVAN) 40 MG tablet Take 40 mg by mouth daily. 01/08/20  Yes [provider]  doxycycline (VIBRAMYCIN) 100 MG capsule Take 1 capsule (100 mg total) by mouth 2 (two) times daily. 03/30/18   Defelice, Para March, NP  metoprolol tartrate (LOPRESSOR) 25 MG tablet Take 0.5 tablets (12.5 mg total) by mouth 2 (two) times daily. Patient not taking: Reported on 03/01/2016 02/03/16   Houston Siren, MD  naproxen sodium (ANAPROX) 220 MG tablet Take 220 mg by mouth 2 (two) times daily.    [provider]  sulfamethoxazole-trimethoprim (BACTRIM DS) 800-160 MG tablet Take 1 tablet by mouth 2 (two) times daily. 04/13/20   Payton Mccallum, MD  ticagrelor (BRILINTA) 90 MG TABS tablet Take 1 tablet (90 mg total) by mouth 2 (two) times daily. 02/03/16   Houston Siren, MD    Family History Family History  Problem Relation Age of Onset  . CAD Father     Social History Social History   Tobacco Use  . Smoking status: Never Smoker  . Smokeless tobacco: Never Used  Substance Use Topics  . Alcohol use: Yes  . Drug use: Never  Allergies   Ibuprofen, Codeine, and Penicillins   Review of Systems Review of Systems   Physical Exam Triage Vital Signs ED Triage Vitals  Enc Vitals Group     BP 04/13/20 1529 (!) 148/88     Pulse Rate 04/13/20 1529 67     Resp 04/13/20 1529 18     Temp 04/13/20 1529 98.4 F (36.9 C)     Temp Source 04/13/20 1529 Oral     SpO2 04/13/20 1529 97 %     Weight 04/13/20 1526 194 lb (88 kg)     Height 04/13/20 1526 5\' 9"  (1.753 m)     Head Circumference --      Peak Flow --      Pain Score 04/13/20 1526 1     Pain Loc --      Pain Edu? --      Excl. in GC? --    No  data found.  Updated Vital Signs BP (!) 148/88 (BP Location: Left Arm)   Pulse 67   Temp 98.4 F (36.9 C) (Oral)   Resp 18   Ht 5\' 9"  (1.753 m)   Wt 88 kg   SpO2 97%   BMI 28.65 kg/m   Visual Acuity Right Eye Distance:   Left Eye Distance:   Bilateral Distance:    Right Eye Near:   Left Eye Near:    Bilateral Near:     Physical Exam Vitals and nursing note reviewed.  Constitutional:      General: He is not in acute distress.    Appearance: He is not toxic-appearing or diaphoretic.  HENT:     Right Ear: A middle ear effusion is present. Tympanic membrane is erythematous and bulging.     Left Ear: A middle ear effusion is present. Tympanic membrane is erythematous and bulging.     Nose: Congestion present.  Cardiovascular:     Heart sounds: Normal heart sounds.  Pulmonary:     Effort: Pulmonary effort is normal. No respiratory distress.     Breath sounds: Normal breath sounds. No stridor. No wheezing, rhonchi or rales.  Musculoskeletal:     Cervical back: Neck supple.  Neurological:     Mental Status: He is alert.      UC Treatments / Results  Labs (all labs ordered are listed, but only abnormal results are displayed) Labs Reviewed - No data to display  EKG   Radiology No results found.  Procedures Procedures (including critical care time)  Medications Ordered in UC Medications - No data to display  Initial Impression / Assessment and Plan / UC Course  I have reviewed the triage vital signs and the nursing notes.  Pertinent labs & imaging results that were available during my care of the patient were reviewed by me and considered in my medical decision making (see chart for details).      Final Clinical Impressions(s) / UC Diagnoses   Final diagnoses:  Bilateral acute serous otitis media, recurrence not specified  Acute maxillary sinusitis, recurrence not specified    ED Prescriptions    Medication Sig Dispense Auth. Provider    sulfamethoxazole-trimethoprim (BACTRIM DS) 800-160 MG tablet Take 1 tablet by mouth 2 (two) times daily. 20 tablet 06/13/20, MD      1. diagnosis reviewed with patient 2. rx as per orders above; reviewed possible side effects, interactions, risks and benefits  3. Recommend continue otc medications 4. Follow-up prn if symptoms worsen or don't improve   PDMP  not reviewed this encounter.   Norval Gable, MD 04/13/20 [phone]

## 2020-04-13 NOTE — ED Triage Notes (Signed)
Patient complains of nasal congestion, sinus pain and pressure, dizziness when he first wakes up in the morning. States that he is concerned he may have an ear infection. Reports that this started around 3 weeks ago and has been taking a decongestant with little relief.

## 2020-05-04 ENCOUNTER — Ambulatory Visit
Admission: EM | Admit: 2020-05-04 | Discharge: 2020-05-04 | Disposition: A | Discharge status: Home / Self Care | Payer: Federal, State, Local not specified - PPO | Attending: Emergency Medicine | Admitting: Emergency Medicine

## 2020-05-04 ENCOUNTER — Encounter: Payer: Self-pay | Admitting: Emergency Medicine

## 2020-05-04 ENCOUNTER — Other Ambulatory Visit: Payer: Self-pay

## 2020-05-04 DIAGNOSIS — H6983 Other specified disorders of Eustachian tube, bilateral: Secondary | ICD-10-CM

## 2020-05-04 HISTORY — DX: Atherosclerotic heart disease of native coronary artery without angina pectoris: I25.10

## 2020-05-04 HISTORY — DX: Hyperlipidemia, unspecified: E78.5

## 2020-05-04 MED ORDER — PREDNISONE 10 MG (21) PO TBPK: ORAL_TABLET | ORAL | 0 refills | Status: DC

## 2020-05-04 NOTE — Discharge Instructions (Addendum)
Continue Flonase, Claritin, phenylephrine.  Finish the prednisone taper unless a provider tells you to stop.  Follow-up with [facility] if you are not getting any better.

## 2020-05-04 NOTE — ED Triage Notes (Signed)
Patient in today c/o bilateral ear fullness x 3-4 days. Patient seen at [facility] 04/13/20 and diagnosed with bilateral ear infections. Patient prescribed medications and he states he took as prescribed. Patient denies fever. Patient has taken OTC decongestant. Patient routinely takes Claritin and Flonase.

## 2020-05-04 NOTE — ED Provider Notes (Signed)
HPI  SUBJECTIVE:  Brian Waters is a 59 y.o. male who presents with 6 weeks of bilateral ear fullness, decreased hearing, dizziness described as "feeling off balance" when he gets up in the morning that resolves after taking a hot shower and some decongestions.  He reports postnasal drip.  No chest pain, shortness of breath, palpitations with the dizziness.  No ear pain.  No otorrhea, fevers, nasal congestion, rhinorrhea.  He reports sinus pressure, but no pain.  No sore throat, cough, facial swelling, upper dental pain.  The dental pain has resolved since finishing the Bactrim.  He has had symptoms like this before and was found to have sinusitis/otitis media.  Patient was seen here 3 weeks ago for this, found to have bilateral serous otitis media and maxillary sinusitis, and was sent home with 10 days of Bactrim DS twice a day.  Patient states that this helped temporarily, but his symptoms returned while on the antibiotics.  His symptoms are better with eating, drinking warm liquids, worse with getting dehydrated.  He is also taking Flonase, Claritin and phenylephrine with improvement in his symptoms.  Past medical history of seasonal allergies, rhinitis, coronary artery disease, NSTEMI, status post stent, frequent sinusitis, otitis media, hypertension, hypercholesterolemia.  No history of diabetes.  PMD: Feldpausch.  ENT: [facility].  Past Medical History:  Diagnosis Date  . Coronary artery disease   . Hyperlipidemia   . Seasonal allergies     Past Surgical History:  Procedure Laterality Date  . CARDIAC CATHETERIZATION Bilateral 02/02/2016   Procedure: Left Heart Cath and Coronary Angiography;  Surgeon: Dalia Heading, MD;  Location: [facility] IN[facility]IVE CV [facility];  Service: Cardiovascular;  Laterality: Bilateral;  . CARDIAC CATHETERIZATION N/A 02/02/2016   Procedure: Coronary Stent Intervention;  Surgeon: Alwyn Pea, MD;  Location: [facility] INVASIVE CV [facility];  Service: Cardiovascular;  Laterality:  N/A;  . CORONARY ANGIOPLASTY WITH STENT PLACEMENT    . CYST EXCISION    . HERNIA REPAIR    . SHOULDER ARTHROSCOPY      Family History  Problem Relation Age of Onset  . CAD Father        CABG x 3  . Prostate cancer Father   . Bladder Cancer Father   . Hyperlipidemia Father   . Other Mother        unknow medical history    Social History   Tobacco Use  . Smoking status: Never Smoker  . Smokeless tobacco: Never Used  Vaping Use  . Vaping Use: Never used  Substance Use Topics  . Alcohol use: Yes    Alcohol/week: 7.0 standard drinks    Types: 7 Glasses of wine per week  . Drug use: Never    No current facility-administered medications for this encounter.  Current Outpatient Medications:  .  aspirin EC 81 MG tablet, Take 1 tablet (81 mg total) by mouth daily., Disp: 60 tablet, Rfl:  .  atorvastatin (LIPITOR) 40 MG tablet, Take 1 tablet (40 mg total) by mouth daily at 6 PM., Disp: 60 tablet, Rfl: 1 .  Cholecalciferol 50 MCG (2000 UT) TABS, Take by mouth., Disp: , Rfl:  .  fluticasone (FLONASE) 50 MCG/ACT nasal spray, Place 2 sprays into both nostrils daily. , Disp: , Rfl:  .  loratadine (CLARITIN) 10 MG tablet, Take 10 mg by mouth daily., Disp: , Rfl:  .  metoprolol tartrate (LOPRESSOR) 25 MG tablet, Take 0.5 tablets (12.5 mg total) by mouth 2 (two) times daily. (Patient not taking:  Reported on 03/01/2016), Disp: 60 tablet, Rfl: 1 .  Multiple Vitamin (MULTIVITAMIN) tablet, Take 1 tablet by mouth daily., Disp: , Rfl:  .  predniSONE (STERAPRED UNI-PAK 21 TAB) 10 MG (21) TBPK tablet, Dispense one 6 day pack. Take as directed with food., Disp: 21 tablet, Rfl: 0 .  ticagrelor (BRILINTA) 90 MG TABS tablet, Take 1 tablet (90 mg total) by mouth 2 (two) times daily., Disp: 60 tablet, Rfl: 1 .  valsartan (DIOVAN) 40 MG tablet, Take 40 mg by mouth daily., Disp: , Rfl:   Allergies  Allergen Reactions  . Ibuprofen Diarrhea  . Codeine Rash  . Penicillins Rash    Has patient had a PCN  reaction causing immediate rash, facial/tongue/throat swelling, SOB or lightheadedness with hypotension: Yes Has patient had a PCN reaction causing severe rash involving mucus membranes or skin necrosis: No Has patient had a PCN reaction that required hospitalization No Has patient had a PCN reaction occurring within the last 10 years: No If all of the above answers are "NO", then may proceed with Cephalosporin use.      ROS  As noted in HPI.   Physical Exam  BP 131/77 (BP Location: Left Arm)   Pulse 77   Temp 98.4 F (36.9 C) (Oral)   Resp 18   Ht 5\' 9"  (1.753 m)   Wt 88.9 kg   SpO2 98%   BMI 28.94 kg/m   Constitutional: Well developed, well nourished, no acute distress Eyes:  EOMI, conjunctiva normal bilaterally HENT: Normocephalic, atraumatic,mucus membranes moist.  External ear, EAC, TMs normal bilaterally.  No pain with traction on pinna, palpation of tragus, palpation of mastoid.  Mild discomfort with palpation of the right TMJ.  No crepitus at the TMJ.  Mild nasal congestion.  Erythematous, but not swollen turbinates.  No maxillary, frontal sinus tenderness.  No obvious postnasal drip.  Hearing grossly intact and equal with hearing aids in place. Respiratory: Normal inspiratory effort Cardiovascular: Normal rate GI: nondistended skin: No rash, skin intact Musculoskeletal: no deformities Neurologic: Alert & oriented x 3, no focal neuro deficits Psychiatric: Speech and behavior appropriate   ED Course   Medications - No data to display  No orders of the defined types were placed in this encounter.   No results found for this or any previous visit (from the past 24 hour(s)). No results found.  ED Clinical Impression  1. Eustachian tube dysfunction, bilateral      ED Assessment/Plan  Previous records reviewed.  As noted in HPI.  Suspect eustachian tube dysfunction causing the ear fullness, temporary dizziness that gets better with decongestants.  There is  no evidence of continued otitis media or sinusitis on physical exam, so I do not think that an additional round of antibiotics would be helpful.  will try 6-day prednisone taper.  Follow-up with  ENT if not better after this.  Continue Flonase, Claritin, phenylephrine.  Discussed MDM, treatment plan, and plan for follow-up with patient. patient agrees with plan.   Meds ordered this encounter  Medications  . predniSONE (STERAPRED UNI-PAK 21 TAB) 10 MG (21) TBPK tablet    Sig: Dispense one 6 day pack. Take as directed with food.    Dispense:  21 tablet    Refill:  0    *This clinic note was created using [employer]. Therefore, there may be occasional mistakes despite careful proofreading.   ?    Melynda Ripple, MD 05/04/20 1529

## 2020-06-07 ENCOUNTER — Emergency Department: Payer: Federal, State, Local not specified - PPO

## 2020-06-07 ENCOUNTER — Emergency Department
Admission: EM | Admit: 2020-06-07 | Discharge: 2020-06-07 | Disposition: A | Discharge status: Home / Self Care | Payer: Federal, State, Local not specified - PPO | Attending: Emergency Medicine | Admitting: Emergency Medicine

## 2020-06-07 ENCOUNTER — Other Ambulatory Visit: Payer: Self-pay

## 2020-06-07 DIAGNOSIS — R1032 Left lower quadrant pain: Secondary | ICD-10-CM | POA: Diagnosis present

## 2020-06-07 DIAGNOSIS — I119 Hypertensive heart disease without heart failure: Secondary | ICD-10-CM | POA: Diagnosis not present

## 2020-06-07 DIAGNOSIS — Z880 Allergy status to penicillin: Secondary | ICD-10-CM | POA: Insufficient documentation

## 2020-06-07 DIAGNOSIS — Z79899 Other long term (current) drug therapy: Secondary | ICD-10-CM | POA: Diagnosis not present

## 2020-06-07 DIAGNOSIS — N201 Calculus of ureter: Secondary | ICD-10-CM | POA: Diagnosis not present

## 2020-06-07 DIAGNOSIS — R112 Nausea with vomiting, unspecified: Secondary | ICD-10-CM | POA: Diagnosis not present

## 2020-06-07 DIAGNOSIS — N1330 Unspecified hydronephrosis: Secondary | ICD-10-CM

## 2020-06-07 DIAGNOSIS — Z7982 Long term (current) use of aspirin: Secondary | ICD-10-CM | POA: Diagnosis not present

## 2020-06-07 DIAGNOSIS — Z955 Presence of coronary angioplasty implant and graft: Secondary | ICD-10-CM | POA: Diagnosis not present

## 2020-06-07 LAB — URINALYSIS, COMPLETE (UACMP) WITH MICROSCOPIC
Bilirubin Urine: NEGATIVE
Glucose, UA: NEGATIVE mg/dL
Ketones, ur: 20 mg/dL — AB
Leukocytes,Ua: NEGATIVE
Nitrite: NEGATIVE
Protein, ur: NEGATIVE mg/dL
RBC / HPF: >50 RBC/hpf — ABNORMAL HIGH (ref 0–5)
Specific Gravity, Urine: 1.039 — ABNORMAL HIGH (ref 1.005–1.030)
Squamous Epithelial / LPF: NONE SEEN (ref 0–5)
pH: 7 (ref 5.0–8.0)

## 2020-06-07 LAB — COMPREHENSIVE METABOLIC PANEL
ALT: 34 U/L (ref 0–44)
AST: 26 U/L (ref 15–41)
Albumin: 4.2 g/dL (ref 3.5–5.0)
Alkaline Phosphatase: 51 U/L (ref 38–126)
Anion gap: 12 (ref 5–15)
BUN: 20 mg/dL (ref 6–20)
CO2: 23 mmol/L (ref 22–32)
Calcium: 9.2 mg/dL (ref 8.9–10.3)
Chloride: 105 mmol/L (ref 98–111)
Creatinine, Ser: 1.29 mg/dL — ABNORMAL HIGH (ref 0.61–1.24)
GFR calc Af Amer: >60 mL/min (ref >60)
GFR calc non Af Amer: >60 mL/min (ref >60)
Glucose, Bld: 141 mg/dL — ABNORMAL HIGH (ref 70–99)
Potassium: 3.8 mmol/L (ref 3.5–5.1)
Sodium: 140 mmol/L (ref 135–145)
Total Bilirubin: 1.3 mg/dL — ABNORMAL HIGH (ref 0.3–1.2)
Total Protein: 7.4 g/dL (ref 6.5–8.1)

## 2020-06-07 LAB — CBC
HCT: 38.7 % — ABNORMAL LOW (ref 39.0–52.0)
Hemoglobin: 13.9 g/dL (ref 13.0–17.0)
MCH: 31.7 pg (ref 26.0–34.0)
MCHC: 35.9 g/dL (ref 30.0–36.0)
MCV: 88.4 fL (ref 80.0–100.0)
Platelets: 236 10*3/uL (ref 150–400)
RBC: 4.38 MIL/uL (ref 4.22–5.81)
RDW: 12.7 % (ref 11.5–15.5)
WBC: 14.9 10*3/uL — ABNORMAL HIGH (ref 4.0–10.5)
nRBC: 0 % (ref 0.0–0.2)

## 2020-06-07 LAB — LIPASE, BLOOD: Lipase: 28 U/L (ref 11–51)

## 2020-06-07 MED ORDER — ONDANSETRON HCL 4 MG/2ML IJ SOLN
4.0000 mg | Freq: Once | INTRAMUSCULAR | Status: AC
  Administered 2020-06-07: 4 mg via INTRAVENOUS
  Filled 2020-06-07: qty 2

## 2020-06-07 MED ORDER — KETOROLAC TROMETHAMINE 30 MG/ML IJ SOLN
15.0000 mg | Freq: Once | INTRAMUSCULAR | Status: AC
  Administered 2020-06-07: 15 mg via INTRAVENOUS
  Filled 2020-06-07: qty 1

## 2020-06-07 MED ORDER — DIPHENHYDRAMINE HCL 50 MG/ML IJ SOLN
50.0000 mg | Freq: Once | INTRAMUSCULAR | Status: AC
  Administered 2020-06-07: 50 mg via INTRAVENOUS
  Filled 2020-06-07: qty 1

## 2020-06-07 MED ORDER — LACTATED RINGERS IV BOLUS
1000.0000 mL | Freq: Once | INTRAVENOUS | Status: AC
  Administered 2020-06-07: 1000 mL via INTRAVENOUS

## 2020-06-07 MED ORDER — IOHEXOL 300 MG/ML  SOLN
100.0000 mL | Freq: Once | INTRAMUSCULAR | Status: AC | PRN
  Administered 2020-06-07: 100 mL via INTRAVENOUS

## 2020-06-07 MED ORDER — METHYLPREDNISOLONE SODIUM SUCC 125 MG IJ SOLR
125.0000 mg | Freq: Once | INTRAMUSCULAR | Status: AC
  Administered 2020-06-07: 125 mg via INTRAVENOUS
  Filled 2020-06-07: qty 2

## 2020-06-07 MED ORDER — FENTANYL CITRATE (PF) 100 MCG/2ML IJ SOLN: 50.0000 ug | Freq: Once | INTRAMUSCULAR | Status: DC

## 2020-06-07 MED ORDER — MORPHINE SULFATE (PF) 4 MG/ML IV SOLN
6.0000 mg | Freq: Once | INTRAVENOUS | Status: AC
  Administered 2020-06-07: 6 mg via INTRAVENOUS
  Filled 2020-06-07: qty 2

## 2020-06-07 NOTE — Discharge Instructions (Signed)
You were seen in the emergency department because of your abdominal pain.  You had the unfortunate pleasure of experiencing a kidney stone.  We gave you fluids and pain meds to help you pass the stone, which I believe you did in the emergency room.  CT scan of your abdomen did not show signs of additional problems.  Please use Tylenol for pain of the next few days as you may have some aching soreness that lingers.  If you develop any fevers or recurrences of pain, please return to the ED.

## 2020-06-07 NOTE — ED Notes (Signed)
Patient did have an allergic reaction to morphine. Patient began to have hives and rash going up left arm from the hand which is where the IV is up to the elbow.

## 2020-06-07 NOTE — ED Provider Notes (Signed)
[facility] Emergency Department Provider Note ____________________________________________   First MD Initiated Contact with Patient 06/07/20 [phone]     (approximate)  I have reviewed the triage vital signs and the nursing notes.  HISTORY  Chief Complaint Abdominal Pain   HPI Brian Waters is a 59 y.o. male who presents to the ED for evaluation of abdominal pain.  History of HTN, HLD and CAD.  Aspirin is only thinner. 2017 left heart catheterization with mid and proximal LAD lesions requiring DES.    Patient reports being in his typical state of health until rapid onset LLQ abdominal pain that began this morning.  Reports feeling well yesterday without symptoms or complaints.  Patient reports 10/10 intensity LLQ abdominal pain, that is sharp and aching in nature, unrelieved by position change and patient has not take any medications prior to arrival.  Patient reports vomiting one time with nonbloody nonbilious emesis due to the severity of the pain.  Reports passing stool this morning that was soft, but not liquid diarrhea, no noted blood, and reports his pain was transiently relieved by this passing a BM.  This is never happened before.  No history of nephrolithiasis.  Denies hematuria, fevers, syncope, epigastric or chest pain.  Patient repeatedly moaning and exclaiming "oh God" in pain and requesting analgesia.  Past Medical History:  Diagnosis Date  . Coronary artery disease   . Hyperlipidemia   . Seasonal allergies     Patient Active Problem List   Diagnosis Date Noted  . 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 Arteriosclerosis of coronary artery 02/18/2016  . NSTEMI (non-ST elevated myocardial infarction) (HCC) 02/02/2016  . Chest pain 01/31/2016  . AC (acromioclavicular) arthritis 10/23/2015  . Allergic rhinitis 07/24/2014    Past Surgical History:  Procedure Laterality Date  . CARDIAC CATHETERIZATION Bilateral  02/02/2016   Procedure: Left Heart Cath and Coronary Angiography;  Surgeon: Dalia HeadingKenneth A Fath, MD;  Location: [facility] INVASIVE CV LAB;  Service: Cardiovascular;  Laterality: Bilateral;  . CARDIAC CATHETERIZATION N/A 02/02/2016   Procedure: Coronary Stent Intervention;  Surgeon: Alwyn Peawayne D Callwood, MD;  Location: [facility] INVASIVE CV LAB;  Service: Cardiovascular;  Laterality: N/A;  . CORONARY ANGIOPLASTY WITH STENT PLACEMENT    . CYST EXCISION    . HERNIA REPAIR    . SHOULDER ARTHROSCOPY      Prior to Admission medications   Medication Sig Start Date End Date Taking? Authorizing Provider  aspirin EC 81 MG tablet Take 1 tablet (81 mg total) by mouth daily. 02/03/16   [place] SirenSainani, Vivek J, MD  atorvastatin (LIPITOR) 40 MG tablet Take 1 tablet (40 mg total) by mouth daily at 6 PM. 02/03/16   [place] SirenSainani, Vivek J, MD  Cholecalciferol 50 MCG (2000 UT) TABS Take by mouth.    [provider]  fluticasone (FLONASE) 50 MCG/ACT nasal spray Place 2 sprays into both nostrils daily.     [provider]  loratadine (CLARITIN) 10 MG tablet Take 10 mg by mouth daily.    [provider]  metoprolol tartrate (LOPRESSOR) 25 MG tablet Take 0.5 tablets (12.5 mg total) by mouth 2 (two) times daily. Patient not taking: Reported on 03/01/2016 02/03/16   [place] SirenSainani, Vivek J, MD  Multiple Vitamin (MULTIVITAMIN) tablet Take 1 tablet by mouth daily.    [provider]  predniSONE (STERAPRED UNI-PAK 21 TAB) 10 MG (21) TBPK tablet Dispense one 6 day pack. Take as directed with food. 05/04/20   Domenick GongMortenson, Ashley, MD  ticagrelor (BRILINTA) 90 MG TABS tablet Take 1 tablet (90 mg total) by mouth 2 (two) times daily. 02/03/16   [place] Siren, MD  valsartan (DIOVAN) 40 MG tablet Take 40 mg by mouth daily. 01/08/20   [provider]    Allergies Morphine and related, Amoxicillin, Ibuprofen, Codeine, and Penicillins  Family History  Problem Relation Age of Onset  . CAD Father        CABG x 3  . Prostate  cancer Father   . Bladder Cancer Father   . Hyperlipidemia Father   . Other Mother        unknow medical history    Social History Social History   Tobacco Use  . Smoking status: Never Smoker  . Smokeless tobacco: Never Used  Vaping Use  . Vaping Use: Never used  Substance Use Topics  . Alcohol use: Yes    Alcohol/week: 7.0 standard drinks    Types: 7 Glasses of wine per week  . Drug use: Never    Review of Systems  Constitutional: No fever/chills Eyes: No visual changes. ENT: No sore throat. Cardiovascular: Denies chest pain. Respiratory: Denies shortness of breath. Gastrointestinal:  No diarrhea.  No constipation.  Positive for LLQ abdominal pain and single episode of emesis. Genitourinary: Negative for dysuria. Musculoskeletal: Negative for back pain. Skin: Negative for rash. Neurological: Negative for headaches, focal weakness or numbness.   ____________________________________________   PHYSICAL EXAM:  VITAL SIGNS: Vitals:   06/07/20 1420 06/07/20 1440  BP: 105/66 119/70  Pulse: 63 72  Resp: 17 15  Temp:    SpO2: 99% 99%      Constitutional: Alert and oriented.  Laying on his right side and moaning in pain.  He is able to roll over onto his back and focus on our conversation for a few minutes, speaking in full sentences.  Gently rocking back and forth due to the pain. Eyes: Conjunctivae are normal. PERRL. EOMI. Head: Atraumatic. Nose: No congestion/rhinnorhea. Mouth/Throat: Mucous membranes are moist.  Oropharynx non-erythematous. Neck: No stridor. No cervical spine tenderness to palpation. Cardiovascular: Normal rate, regular rhythm. Grossly normal heart sounds.  Good peripheral circulation. Respiratory: Normal respiratory effort.  No retractions. Lungs CTAB. Gastrointestinal: Soft , nondistended. No abdominal bruits. No CVA tenderness.  LLQ tenderness to palpation without peritoneal signs.  Epigastrium, RUQ nontender. Musculoskeletal: No lower  extremity tenderness nor edema.  No joint effusions. No signs of acute trauma. Neurologic:  Normal speech and language. No gross focal neurologic deficits are appreciated. No gait instability noted. Skin:  Skin is warm, dry and intact. No rash noted. Psychiatric: Mood and affect are normal. Speech and behavior are normal.  ____________________________________________   LABS (all labs ordered are listed, but only abnormal results are displayed)  Labs Reviewed  COMPREHENSIVE METABOLIC PANEL - Abnormal; Notable for the following components:      Result Value   Glucose, Bld 141 (*)    Creatinine, Ser 1.29 (*)    Total Bilirubin 1.3 (*)    All other components within normal limits  CBC - Abnormal; Notable for the following components:   WBC 14.9 (*)    HCT 38.7 (*)    All other components within normal limits  URINALYSIS, COMPLETE (UACMP) WITH MICROSCOPIC - Abnormal; Notable for the following components:   Color, Urine YELLOW (*)    APPearance CLEAR (*)    Specific Gravity, Urine 1.039 (*)    Hgb urine dipstick LARGE (*)    Ketones, ur 20 (*)  RBC / HPF >50 (*)    Bacteria, UA RARE (*)    All other components within normal limits  LIPASE, BLOOD   ____________________________________________  12 Lead EKG   ____________________________________________  RADIOLOGY  ED MD interpretation:    Official radiology report(s): CT ABDOMEN PELVIS W CONTRAST  Result Date: 06/07/2020 CLINICAL DATA:  Abdominal pain described as acute left lower quadrant pain. EXAM: CT ABDOMEN AND PELVIS WITH CONTRAST TECHNIQUE: Multidetector CT imaging of the abdomen and pelvis was performed using the standard protocol following bolus administration of intravenous contrast. CONTRAST:  OMNIPAQUE IOHEXOL 300 MG/ML  SOLN COMPARISON:  None. FINDINGS: Lower chest: No acute abnormality. Hepatobiliary: No focal liver abnormality is seen. No gallstones, gallbladder wall thickening, or biliary dilatation.  Pancreas: Unremarkable. No pancreatic ductal dilatation or surrounding inflammatory changes. Spleen: Normal in size without focal abnormality. Adrenals/Urinary Tract: No adrenal masses. There is left perinephric stranding, with a mild delay and left renal enhancement and excretion. Mild left hydronephrosis and hydroureter. These findings are due to a 3-4 mm distal left ureteral stone approximately 1 cm above the ureterovesicular junction. No other ureteral stones. No right hydronephrosis or ureteral dilation. No renal masses. No intrarenal stones. Bladder is unremarkable. Stomach/Bowel: Stomach is within normal limits. Appendix appears normal. No evidence of bowel wall thickening, distention, or inflammatory changes. Vascular/Lymphatic: Normal aorta. Common iliac artery left internal iliac artery atherosclerotic calcifications. No stenosis. No enlarged lymph nodes. Reproductive: Unremarkable. Other: No abdominal wall hernia or abnormality. No abdominopelvic ascites. Musculoskeletal: No acute or significant osseous findings. IMPRESSION: 1. 3-4 mm stone in the distal left ureter causes mild left hydroureteronephrosis. 2. No other acute abnormality. 3. No intrarenal stones. Electronically Signed   By: Amie Portland M.D.   On: 06/07/2020 11:38    ____________________________________________   PROCEDURES and INTERVENTIONS  Procedure(s) performed (including Critical Care):  Procedures  Medications  lactated ringers bolus 1,000 mL (0 mLs Intravenous Stopped 06/07/20 1231)  morphine 4 MG/ML injection 6 mg (6 mg Intravenous Given 06/07/20 0936)  diphenhydrAMINE (BENADRYL) injection 50 mg (50 mg Intravenous Given 06/07/20 1062)  methylPREDNISolone sodium succinate (SOLU-MEDROL) 125 mg/2 mL injection 125 mg (125 mg Intravenous Given 06/07/20 0952)  ondansetron (ZOFRAN) injection 4 mg (4 mg Intravenous Given 06/07/20 0952)  iohexol (OMNIPAQUE) 300 MG/ML solution 100 mL (100 mLs Intravenous Contrast Given 06/07/20  1101)  lactated ringers bolus 1,000 mL (0 mLs Intravenous Stopped 06/07/20 1504)  ketorolac (TORADOL) 30 MG/ML injection 15 mg (15 mg Intravenous Given 06/07/20 1246)    ____________________________________________   INITIAL IMPRESSION / ASSESSMENT AND PLAN / ED COURSE  59 year old male presenting with evidence of ureterolithiasis and amenable to outpatient management.  Normal vital signs and room air.  Exam initially with a very uncomfortable-appearing patient complaining of lower LLQ abdominal pain.  He is uncomfortable in the bed and has a clinical picture of ureterolithiasis.  CT imaging with evidence of 4 mm distal ureteral stone as the source of his symptoms.  No evidence of coexisting intra-abdominal pathology.  Urine without infectious features.  Patient reports improved symptoms after fluid resuscitation and analgesia.  Of note, patient had hives reaction to morphine, but no evidence of anaphylaxis.  These resolved immediately after administration of IV Benadryl.  Provided patient strainer to take home, he is likely already passed the stone here in the ED considering his drastic clinical improvement.  Return precautions to the ED were discussed.  Patient is medically stable for discharge home.  Clinical Course as of Jun 08 1615  Sat Jun 07, 2020  [id number] Informed by RN of development of hives and itchy sensation after ministration of morphine.  Immediately reassessed the patient and note hives throughout his left upper extremity.  Patient is supine without distress, reporting improved abdominal pain and it "being worth it" due to the improved pain.  Benadryl, Solu-Medrol and Zofran ordered and being administered by the nurse now   [DS]    Clinical Course User Index [DS] Delton Prairie, MD     ____________________________________________   FINAL CLINICAL IMPRESSION(S) / ED DIAGNOSES  Final diagnoses:  Ureterolithiasis  LLQ abdominal pain  Non-intractable vomiting with nausea,  unspecified vomiting type  Hydroureteronephrosis     ED Discharge Orders    None       Lleyton Byers   Note:  This document was prepared using Dragon voice recognition software and may include unintentional dictation errors.   Delton Prairie, MD 06/07/20 [phone]

## 2020-06-07 NOTE — ED Triage Notes (Signed)
Pt arrives via [facility] from home for LLQ sided abd pain. Vomited once. Denies diarrhea. Pt yelling out about pain. Pain began this AM. A&O x4.

## 2020-06-25 ENCOUNTER — Other Ambulatory Visit: Payer: Self-pay

## 2020-06-25 ENCOUNTER — Ambulatory Visit
Admission: EM | Admit: 2020-06-25 | Discharge: 2020-06-25 | Disposition: A | Discharge status: Home / Self Care | Payer: Federal, State, Local not specified - PPO | Attending: Emergency Medicine | Admitting: Emergency Medicine

## 2020-06-25 ENCOUNTER — Encounter: Payer: Self-pay | Admitting: Emergency Medicine

## 2020-06-25 DIAGNOSIS — R3915 Urgency of urination: Secondary | ICD-10-CM | POA: Diagnosis present

## 2020-06-25 LAB — URINALYSIS, COMPLETE (UACMP) WITH MICROSCOPIC
Bacteria, UA: NONE SEEN
Bilirubin Urine: NEGATIVE
Glucose, UA: NEGATIVE mg/dL
Hgb urine dipstick: NEGATIVE
Ketones, ur: NEGATIVE mg/dL
Nitrite: NEGATIVE
Protein, ur: NEGATIVE mg/dL
RBC / HPF: NONE SEEN RBC/hpf (ref 0–5)
Specific Gravity, Urine: 1.025 (ref 1.005–1.030)
Squamous Epithelial / HPF: NONE SEEN (ref 0–5)
pH: 5.5 (ref 5.0–8.0)

## 2020-06-25 MED ORDER — PHENAZOPYRIDINE HCL 200 MG PO TABS
200.0000 mg | ORAL_TABLET | Freq: Three times a day (TID) | ORAL | 0 refills | Status: DC | PRN
Start: 2020-06-25 — End: 2020-07-01

## 2020-06-25 NOTE — ED Provider Notes (Signed)
HPI  SUBJECTIVE:  Brian Waters is a 59 y.o. male who presents with 11 days of feeling like he needs to urinate "all the time" since passing 3 kidney stones 11 days ago.  He reports urgency, frequency.  Some burning pain after he urinates.  No dysuria, cloudy odorous urine, hematuria, nausea, vomiting, fevers, abdominal, back, pelvic pain.  No penile rash, discharge, no testicular complaints.  No urinary incontinence.  He is in a long-term monogamous relationship with his wife who is asymptomatic.  STDs are not a concern today.  He denies increased thirst, unintentional weight loss.  He has never had symptoms like this before.  He has tried pushing fluids without improvement in his symptoms. no Aggravating or alleviating factors.  He has a past medical history of nephrolithiasis, NSTEMI status post stent placement.  He is on 81 mg aspirin/statin.  No history of diabetes, BPH, UTI, pyelonephritis.  EXN:[id number], Madaline Guthrie, MD   Past Medical History:  Diagnosis Date  . Coronary artery disease   . Hyperlipidemia   . Seasonal allergies     Past Surgical History:  Procedure Laterality Date  . CARDIAC CATHETERIZATION Bilateral 02/02/2016   Procedure: Left Heart Cath and Coronary Angiography;  Surgeon: Dalia Heading, MD;  Location: [facility] INVASIVE CV [facility];  Service: Cardiovascular;  Laterality: Bilateral;  . CARDIAC CATHETERIZATION N/A 02/02/2016   Procedure: Coronary Stent Intervention;  Surgeon: Alwyn Pea, MD;  Location: [facility] INVASIVE CV LAB;  Service: Cardiovascular;  Laterality: N/A;  . CORONARY ANGIOPLASTY WITH STENT PLACEMENT    . CYST EXCISION    . HERNIA REPAIR    . SHOULDER ARTHROSCOPY      Family History  Problem Relation Age of Onset  . CAD Father        CABG x 3  . Prostate cancer Father   . Bladder Cancer Father   . Hyperlipidemia Father   . Other Mother        unknow medical history    Social History   Tobacco Use  . Smoking status: Never Smoker  . Smokeless  tobacco: Never Used  Vaping Use  . Vaping Use: Never used  Substance Use Topics  . Alcohol use: Yes    Alcohol/week: 7.0 standard drinks    Types: 7 Glasses of wine per week  . Drug use: Never    No current facility-administered medications for this encounter.  Current Outpatient Medications:  .  aspirin EC 81 MG tablet, Take 1 tablet (81 mg total) by mouth daily., Disp: 60 tablet, Rfl:  .  atorvastatin (LIPITOR) 40 MG tablet, Take 1 tablet (40 mg total) by mouth daily at 6 PM., Disp: 60 tablet, Rfl: 1 .  Cholecalciferol 50 MCG (2000 UT) TABS, Take by mouth., Disp: , Rfl:  .  metoprolol tartrate (LOPRESSOR) 25 MG tablet, Take 0.5 tablets (12.5 mg total) by mouth 2 (two) times daily., Disp: 60 tablet, Rfl: 1 .  valsartan (DIOVAN) 40 MG tablet, Take 40 mg by mouth daily., Disp: , Rfl:  .  fluticasone (FLONASE) 50 MCG/ACT nasal spray, Place 2 sprays into both nostrils daily. , Disp: , Rfl:  .  loratadine (CLARITIN) 10 MG tablet, Take 10 mg by mouth daily., Disp: , Rfl:  .  Multiple Vitamin (MULTIVITAMIN) tablet, Take 1 tablet by mouth daily., Disp: , Rfl:  .  phenazopyridine (PYRIDIUM) 200 MG tablet, Take 1 tablet (200 mg total) by mouth 3 (three) times daily as needed for pain., Disp: 6 tablet, Rfl: 0 .  predniSONE (STERAPRED UNI-PAK 21 TAB) 10 MG (21) TBPK tablet, Dispense one 6 day pack. Take as directed with food., Disp: 21 tablet, Rfl: 0 .  ticagrelor (BRILINTA) 90 MG TABS tablet, Take 1 tablet (90 mg total) by mouth 2 (two) times daily., Disp: 60 tablet, Rfl: 1  Allergies  Allergen Reactions  . Morphine And Related Hives and Rash  . Amoxicillin   . Ibuprofen Diarrhea  . Codeine Rash  . Penicillins Rash    Has patient had a PCN reaction causing immediate rash, facial/tongue/throat swelling, SOB or lightheadedness with hypotension: Yes Has patient had a PCN reaction causing severe rash involving mucus membranes or skin necrosis: No Has patient had a PCN reaction that required  hospitalization No Has patient had a PCN reaction occurring within the last 10 years: No If all of the above answers are "NO", then may proceed with Cephalosporin use.      ROS  As noted in HPI.   Physical Exam  BP 137/85 (BP Location: Right Arm)   Pulse 74   Temp 98.6 F (37 C) (Oral)   Resp 18   Ht 5\' 9"  (1.753 m)   Wt 88 kg   SpO2 99%   BMI 28.65 kg/m   Constitutional: Well developed, well nourished, no acute distress Eyes:  EOMI, conjunctiva normal bilaterally HENT: Normocephalic, atraumatic,mucus membranes moist Respiratory: Normal inspiratory effort Cardiovascular: Normal rate GI: nondistended.  Soft.  No suprapubic, flank tenderness. Back: No CVAT Rectal: Normal size, nontender firm prostate. skin: No rash, skin intact Musculoskeletal: no deformities Neurologic: Alert & oriented x 3, no focal neuro deficits Psychiatric: Speech and behavior appropriate   ED Course   Medications - No data to display  Orders Placed This Encounter  Procedures  . Urinalysis, Complete w Microscopic    Standing Status:   Standing    Number of Occurrences:   1    Results for orders placed or performed during the hospital encounter of 06/25/20 (from the past 24 hour(s))  Urinalysis, Complete w Microscopic Urine, Clean Catch     Status: Abnormal   Collection Time: 06/25/20  3:27 PM  Result Value Ref Range   Color, Urine YELLOW YELLOW   APPearance CLEAR CLEAR   Specific Gravity, Urine 1.025 1.005 - 1.030   pH 5.5 5.0 - 8.0   Glucose, UA NEGATIVE NEGATIVE mg/dL   Hgb urine dipstick NEGATIVE NEGATIVE   Bilirubin Urine NEGATIVE NEGATIVE   Ketones, ur NEGATIVE NEGATIVE mg/dL   Protein, ur NEGATIVE NEGATIVE mg/dL   Nitrite NEGATIVE NEGATIVE   Leukocytes,Ua TRACE (A) NEGATIVE   Squamous Epithelial / LPF NONE SEEN 0 - 5   WBC, UA 0-5 0 - 5 WBC/hpf   RBC / HPF NONE SEEN 0 - 5 RBC/hpf   Bacteria, UA NONE SEEN NONE SEEN   No results found.  ED Clinical Impression  1.  Urinary urgency      ED Assessment/Plan  UA negative for UTI.    Prostate normal size, nontender.  Doubt BPH, prostatitis.  No glucose in the urine, doubt new onset diabetes.  He has trace leukocytes, I suspect this is residual inflammation from passing 3 kidney stones.  Feel that he is at low risk for urethritis, STD.  Will send home with 3 days of Pyridium, by the time he finishes that he will be 2 weeks post passage of stones.  If he is not better, then he will need to follow-up with urology or with his PMD.  Discussed labs, ,  MDM, treatment plan, and plan for follow-up with patient. . patient agrees with plan.   Meds ordered this encounter  Medications  . phenazopyridine (PYRIDIUM) 200 MG tablet    Sig: Take 1 tablet (200 mg total) by mouth 3 (three) times daily as needed for pain.    Dispense:  6 tablet    Refill:  0    *This clinic note was created using Scientist, clinical (histocompatibility and immunogenetics). Therefore, there may be occasional mistakes despite careful proofreading.   ?    Domenick Gong, MD 06/25/20 [phone]

## 2020-06-25 NOTE — Discharge Instructions (Addendum)
Finish the Pyridium.  It should help with your symptoms.  Continue drinking plenty of fluid.

## 2020-06-25 NOTE — ED Triage Notes (Signed)
Patient states he passed a few kidney stones a few weeks ago and since then has had urinary frequency and urgency.

## 2020-07-01 ENCOUNTER — Encounter: Payer: Self-pay | Admitting: Urology

## 2020-07-01 ENCOUNTER — Other Ambulatory Visit: Payer: Self-pay

## 2020-07-01 ENCOUNTER — Ambulatory Visit: Payer: Federal, State, Local not specified - PPO | Admitting: Urology

## 2020-07-01 VITALS — BP 126/87 | HR 68 | Ht 69.0 in | Wt 197.0 lb

## 2020-07-01 DIAGNOSIS — N200 Calculus of kidney: Secondary | ICD-10-CM | POA: Diagnosis not present

## 2020-07-01 NOTE — Progress Notes (Signed)
07/01/20 1:28 PM   Brian Waters 1961-06-20 [id number]  CC: Kidney stone, dysuria  HPI: I saw Brian Waters in urology clinic for the above issues.  He is a 59 year old male who originally presented to the ED on 7/31 with acute onset of severe left groin and flank pain and was found to have a 4 mm left distal ureteral stone with upstream hydronephrosis. There were no other renal stones noted. He reports that his pain resolved within 24 hours of being in the ER, and he thinks he passed his stone. He then represented to the ED on 8/18 with urinary urgency and frequency and some occasional dysuria. Urinalysis was completely benign at that time with no microscopic hematuria or other abnormalities. He had been increasing his fluids after passing the stone to try to prevent further stone episodes. He really denies any significant complaints today. He denies any gross hematuria or prior history of stones. He denies any flank pain.  He is a non-smoker, but has a family history of muscle invasive bladder cancer in his father.  PMH: Past Medical History:  Diagnosis Date  . Coronary artery disease   . Hyperlipidemia   . Seasonal allergies     Surgical History: Past Surgical History:  Procedure Laterality Date  . CARDIAC CATHETERIZATION Bilateral 02/02/2016   Procedure: Left Heart Cath and Coronary Angiography;  Surgeon: Dalia Heading, MD;  Location: [facility];  Service: Cardiovascular;  Laterality: Bilateral;  . CARDIAC CATHETERIZATION N/A 02/02/2016   Procedure: Coronary Stent Intervention;  Surgeon: Alwyn Pea, MD;  Location: [facility] CV [facility];  Service: Cardiovascular;  Laterality: N/A;  . CORONARY ANGIOPLASTY WITH STENT PLACEMENT    . CYST EXCISION    . HERNIA REPAIR    . SHOULDER ARTHROSCOPY      Family History: Family History  Problem Relation Age of Onset  . CAD Father        CABG x 3  . Prostate cancer Father   . Bladder Cancer Father   . Hyperlipidemia  Father   . Other Mother        unknow medical history    Social History:  reports that he has never smoked. He has never used smokeless tobacco. He reports current alcohol use of about 7.0 standard drinks of alcohol per week. He reports that he does not use drugs.  Physical Exam: BP 126/87   Pulse 68   Ht 5\' 9"  (1.753 m)   Wt 197 lb (89.4 kg)   BMI 29.09 kg/m    Constitutional:  Alert and oriented, No acute distress. Cardiovascular: No clubbing, cyanosis, or edema. Respiratory: Normal respiratory effort, no increased work of breathing. GI: Abdomen is soft, nontender, nondistended, no abdominal masses GU: No CVA tenderness Widely patent meatus  Laboratory Data: Reviewed, see HPI  Pertinent Imaging: I have personally reviewed the CT from 7/31 showing a 4 mm left distal ureteral stone with upstream hydronephrosis, and no other renal stones  Assessment & Plan:   In summary, is a 59 year old male with a likely spontaneously passed stone based on negative urinalysis and resolution of flank pain.   We discussed general stone prevention strategies including adequate hydration with goal of producing 2.5 L of urine daily, increasing citric acid intake, increasing calcium intake during high oxalate meals, minimizing animal protein, and decreasing salt intake. Information about dietary recommendations given today.   I recommended follow-up in 1 to 2 months with a repeat urinalysis to confirm no residual  microscopic hematuria that would warrant cystoscopy or further imaging. Follow-up sooner if recurrent pain or urinary symptoms  I spent 45 total minutes on the day of the encounter including pre-visit review of the medical record, face-to-face time with the patient, and post visit ordering of labs/imaging/tests.  Legrand Rams, MD 07/01/2020  [facility] [address], [address] [place], [place] [address] [phone]

## 2020-07-01 NOTE — Patient Instructions (Signed)
Kidney Stones  Kidney stones are solid, rock-like deposits that form inside of the kidneys. The kidneys are a pair of organs that make urine. A kidney stone may form in a kidney and move into other parts of the urinary tract, including the tubes that connect the kidneys to the bladder (ureters), the bladder, and the tube that carries urine out of the body (urethra). As the stone moves through these areas, it can cause intense pain and block the flow of urine. Kidney stones are created when high levels of certain minerals are found in the urine. The stones are usually passed out of the body through urination, but in some cases, medical treatment may be needed to remove them. What are the causes? Kidney stones may be caused by:  A condition in which certain glands produce too much parathyroid hormone (primary hyperparathyroidism), which causes too much calcium buildup in the blood.  A buildup of uric acid crystals in the bladder (hyperuricosuria). Uric acid is a chemical that the body produces when you eat certain foods. It usually exits the body in the urine.  Narrowing (stricture) of one or both of the ureters.  A kidney blockage that is present at birth (congenital obstruction).  Past surgery on the kidney or the ureters, such as gastric bypass surgery. What increases the risk? The following factors may make you more likely to develop this condition:  Having had a kidney stone in the past.  Having a family history of kidney stones.  Not drinking enough water.  Eating a diet that is high in protein, salt (sodium), or sugar.  Being overweight or obese. What are the signs or symptoms? Symptoms of a kidney stone may include:  Pain in the side of the abdomen, right below the ribs (flank pain). Pain usually spreads (radiates) to the groin.  Needing to urinate frequently or urgently.  Painful urination.  Blood in the urine (hematuria).  Nausea.  Vomiting.  Fever and chills. How  is this diagnosed? This condition may be diagnosed based on:  Your symptoms and medical history.  A physical exam.  Blood tests.  Urine tests. These may be done before and after the stone passes out of your body through urination.  Imaging tests, such as a CT scan, abdominal X-ray, or ultrasound.  A procedure to examine the inside of the bladder (cystoscopy). How is this treated? Treatment for kidney stones depends on the size, location, and makeup of the stones. Kidney stones will often pass out of the body through urination. You may need to:  Increase your fluid intake to help pass the stone. In some cases, you may be given fluids through an IV and may need to be monitored at the hospital.  Take medicine for pain.  Make changes in your diet to help prevent kidney stones from coming back. Sometimes, medical procedures are needed to remove a kidney stone. This may involve:  A procedure to break up kidney stones using: ? A focused beam of light (laser therapy). ? Shock waves (extracorporeal shock wave lithotripsy).  Surgery to remove kidney stones. This may be needed if you have severe pain or have stones that block your urinary tract. Follow these instructions at home: Medicines  Take over-the-counter and prescription medicines only as told by your health care provider.  Ask your health care provider if the medicine prescribed to you requires you to avoid driving or using heavy machinery. Eating and drinking  Drink enough fluid to keep your urine pale yellow.   You may be instructed to drink at least 8-10 glasses of water each day. This will help you pass the kidney stone.  If directed, change your diet. This may include: ? Limiting how much sodium you eat. ? Eating more fruits and vegetables. ? Limiting how much animal protein--such as red meat, poultry, fish, and eggs--you eat.  Follow instructions from your health care provider about eating or drinking  restrictions. General instructions  Collect urine samples as told by your health care provider. You may need to collect a urine sample: ? 24 hours after you pass the stone. ? 8-12 weeks after passing the kidney stone, and every 6-12 months after that.  Strain your urine every time you urinate, for as long as directed. Use the strainer that your health care provider recommends.  Do not throw out the kidney stone after passing it. Keep the stone so it can be tested by your health care provider. Testing the makeup of your kidney stone may help prevent you from getting kidney stones in the future.  Keep all follow-up visits as told by your health care provider. This is important. You may need follow-up X-rays or ultrasounds to make sure that your stone has passed. How is this prevented? To prevent another kidney stone:  Drink enough fluid to keep your urine pale yellow. This is the best way to prevent kidney stones.  Eat a healthy diet and follow recommendations from your health care provider about foods to avoid. You may be instructed to eat a low-protein diet. Recommendations vary depending on the type of kidney stone that you have.  Maintain a healthy weight. Where to find more information  National Kidney Foundation (NKF): www.kidney.org  [employer] (UCF): www.urologyhealth.org Contact a health care provider if:  You have pain that gets worse or does not get better with medicine. Get help right away if:  You have a fever or chills.  You develop severe pain.  You develop new abdominal pain.  You faint.  You are unable to urinate. Summary  Kidney stones are solid, rock-like deposits that form inside of the kidneys.  Kidney stones can cause nausea, vomiting, blood in the urine, abdominal pain, and the urge to urinate frequently.  Treatment for kidney stones depends on the size, location, and makeup of the stones. Kidney stones will often pass out of the body  through urination.  Kidney stones can be prevented by drinking enough fluids, eating a healthy diet, and maintaining a healthy weight. This information is not intended to replace advice given to you by your health care provider. Make sure you discuss any questions you have with your health care provider. Document Revised: 03/13/2019 Document Reviewed: 03/13/2019 Elsevier Patient Education  2020 Elsevier Inc.   Dietary Guidelines to Help Prevent Kidney Stones Kidney stones are deposits of minerals and salts that form inside your kidneys. Your risk of developing kidney stones may be greater depending on your diet, your lifestyle, the medicines you take, and whether you have certain medical conditions. Most people can reduce their chances of developing kidney stones by following the instructions below. Depending on your overall health and the type of kidney stones you tend to develop, your dietitian may give you more specific instructions. What are tips for following this plan? Reading food labels  Choose foods with "no salt added" or "low-salt" labels. Limit your sodium intake to less than 1500 mg per day.  Choose foods with calcium for each meal and snack. Try to eat   about 300 mg of calcium at each meal. Foods that contain 200-500 mg of calcium per serving include: ? 8 oz (237 ml) of milk, fortified nondairy milk, and fortified fruit juice. ? 8 oz (237 ml) of kefir, yogurt, and soy yogurt. ? 4 oz (118 ml) of tofu. ? 1 oz of cheese. ? 1 cup (300 g) of dried figs. ? 1 cup (91 g) of cooked broccoli. ? 1-3 oz can of sardines or mackerel.  Most people need 1000 to 1500 mg of calcium each day. Talk to your dietitian about how much calcium is recommended for you. Shopping  Buy plenty of fresh fruits and vegetables. Most people do not need to avoid fruits and vegetables, even if they contain nutrients that may contribute to kidney stones.  When shopping for convenience foods, choose: ? Whole  pieces of fruit. ? Premade salads with dressing on the side. ? Low-fat fruit and yogurt smoothies.  Avoid buying frozen meals or prepared deli foods.  Look for foods with live cultures, such as yogurt and kefir. Cooking  Do not add salt to food when cooking. Place a salt shaker on the table and allow each person to add his or her own salt to taste.  Use vegetable protein, such as beans, textured vegetable protein (TVP), or tofu instead of meat in pasta, casseroles, and soups. Meal planning   Eat less salt, if told by your dietitian. To do this: ? Avoid eating processed or premade food. ? Avoid eating fast food.  Eat less animal protein, including cheese, meat, poultry, or fish, if told by your dietitian. To do this: ? Limit the number of times you have meat, poultry, fish, or cheese each week. Eat a diet free of meat at least 2 days a week. ? Eat only one serving each day of meat, poultry, fish, or seafood. ? When you prepare animal protein, cut pieces into small portion sizes. For most meat and fish, one serving is about the size of one deck of cards.  Eat at least 5 servings of fresh fruits and vegetables each day. To do this: ? Keep fruits and vegetables on hand for snacks. ? Eat 1 piece of fruit or a handful of berries with breakfast. ? Have a salad and fruit at lunch. ? Have two kinds of vegetables at dinner.  Limit foods that are high in a substance called oxalate. These include: ? Spinach. ? Rhubarb. ? Beets. ? Potato chips and french fries. ? Nuts.  If you regularly take a diuretic medicine, make sure to eat at least 1-2 fruits or vegetables high in potassium each day. These include: ? Avocado. ? Banana. ? Orange, prune, carrot, or tomato juice. ? Baked potato. ? Cabbage. ? Beans and split peas. General instructions   Drink enough fluid to keep your urine clear or pale yellow. This is the most important thing you can do.  Talk to your health care provider and  dietitian about taking daily supplements. Depending on your health and the cause of your kidney stones, you may be advised: ? Not to take supplements with vitamin C. ? To take a calcium supplement. ? To take a daily probiotic supplement. ? To take other supplements such as magnesium, fish oil, or vitamin B6.  Take all medicines and supplements as told by your health care provider.  Limit alcohol intake to no more than 1 drink a day for nonpregnant women and 2 drinks a day for men. One drink equals 12 oz   of beer, 5 oz of wine, or 1 oz of hard liquor.  Lose weight if told by your health care provider. Work with your dietitian to find strategies and an eating plan that works best for you. What foods are not recommended? Limit your intake of the following foods, or as told by your dietitian. Talk to your dietitian about specific foods you should avoid based on the type of kidney stones and your overall health. Grains Breads. Bagels. Rolls. Baked goods. Salted crackers. Cereal. Pasta. Vegetables Spinach. Rhubarb. Beets. Canned vegetables. Pickles. Olives. Meats and other protein foods Nuts. Nut butters. Large portions of meat, poultry, or fish. Salted or cured meats. Deli meats. Hot dogs. Sausages. Dairy Cheese. Beverages Regular soft drinks. Regular vegetable juice. Seasonings and other foods Seasoning blends with salt. Salad dressings. Canned soups. Soy sauce. Ketchup. Barbecue sauce. Canned pasta sauce. Casseroles. Pizza. Lasagna. Frozen meals. Potato chips. French fries. Summary  You can reduce your risk of kidney stones by making changes to your diet.  The most important thing you can do is drink enough fluid. You should drink enough fluid to keep your urine clear or pale yellow.  Ask your health care provider or dietitian how much protein from animal sources you should eat each day, and also how much salt and calcium you should have each day. This information is not intended to  replace advice given to you by your health care provider. Make sure you discuss any questions you have with your health care provider. Document Revised: 02/14/2019 Document Reviewed: 10/05/2016 Elsevier Patient Education  2020 Elsevier Inc.   

## 2020-07-03 ENCOUNTER — Telehealth: Payer: Self-pay

## 2020-07-03 NOTE — Telephone Encounter (Signed)
Patient states that he wanted to check and see if Dr. Richardo Hanks did think he passed his stone. He states he is having some urgency at times. Office note was reference to patient that UA was negative and that stone was most likely passed. He was told to monitor symptoms and if they worsened or if he developed dysuria, blood, fever, chills, nausea, flank pain, or vomiting to call back for further evaluation.

## 2020-07-03 NOTE — Telephone Encounter (Signed)
Patient left vmail, attempted to return call left message for patient to return call

## 2020-08-04 ENCOUNTER — Other Ambulatory Visit: Payer: Self-pay | Admitting: *Deleted

## 2020-08-04 DIAGNOSIS — N200 Calculus of kidney: Secondary | ICD-10-CM

## 2020-08-05 ENCOUNTER — Encounter: Payer: Self-pay | Admitting: Urology

## 2020-08-05 ENCOUNTER — Ambulatory Visit: Payer: Federal, State, Local not specified - PPO | Admitting: Urology

## 2020-08-05 ENCOUNTER — Other Ambulatory Visit: Payer: Self-pay

## 2020-08-05 ENCOUNTER — Other Ambulatory Visit
Admission: RE | Admit: 2020-08-05 | Discharge: 2020-08-05 | Disposition: A | Discharge status: Home / Self Care | Payer: Federal, State, Local not specified - PPO | Attending: Urology | Admitting: Urology

## 2020-08-05 VITALS — BP 138/86 | HR 64 | Ht 69.0 in | Wt 195.8 lb

## 2020-08-05 DIAGNOSIS — Z125 Encounter for screening for malignant neoplasm of prostate: Secondary | ICD-10-CM

## 2020-08-05 DIAGNOSIS — N200 Calculus of kidney: Secondary | ICD-10-CM | POA: Insufficient documentation

## 2020-08-05 LAB — URINALYSIS, COMPLETE (UACMP) WITH MICROSCOPIC
Bacteria, UA: NONE SEEN
Bilirubin Urine: NEGATIVE
Glucose, UA: NEGATIVE mg/dL
Hgb urine dipstick: NEGATIVE
Ketones, ur: NEGATIVE mg/dL
Leukocytes,Ua: NEGATIVE
Nitrite: NEGATIVE
Protein, ur: NEGATIVE mg/dL
RBC / HPF: NONE SEEN RBC/hpf (ref 0–5)
Specific Gravity, Urine: 1.01 (ref 1.005–1.030)
Squamous Epithelial / HPF: NONE SEEN (ref 0–5)
WBC, UA: NONE SEEN WBC/hpf (ref 0–5)
pH: 7 (ref 5.0–8.0)

## 2020-08-05 NOTE — Patient Instructions (Signed)
Prostate Cancer Screening  Prostate cancer screening is a test that is done to check for the presence of prostate cancer in men. The prostate gland is a walnut-sized gland that is located below the bladder and in front of the rectum in males. The function of the prostate is to add fluid to semen during ejaculation. Prostate cancer is the second most common type of cancer in men. Who should have prostate cancer screening?  Screening recommendations vary based on age and other risk factors. Screening is recommended if:  You are older than age 55. If you are age 55-69, talk with your health care provider about your need for screening and how often screening should be done. Because most prostate cancers are slow growing and will not cause death, screening is generally reserved in this age group for men who have a 10-15-year life expectancy.  You are younger than age 55, and you have these risk factors: ? Being a black male or a male of African descent. ? Having a father, brother, or uncle who has been diagnosed with prostate cancer. The risk is higher if your family member's cancer occurred at an early age. Screening is not recommended if:  You are younger than age 40.  You are between the ages of 40 and 54 and you have no risk factors.  You are 70 years of age or older. At this age, the risks that screening can cause are greater than the benefits that it may provide. If you are at high risk for prostate cancer, your health care provider may recommend that you have screenings more often or that you start screening at a younger age. How is screening for prostate cancer done? The recommended prostate cancer screening test is a blood test called the prostate-specific antigen (PSA) test. PSA is a protein that is made in the prostate. As you age, your prostate naturally produces more PSA. Abnormally high PSA levels may be caused by:  Prostate cancer.  An enlarged prostate that is not caused by cancer  (benign prostatic hyperplasia, BPH). This condition is very common in older men.  A prostate gland infection (prostatitis). Depending on the PSA results, you may need more tests, such as:  A physical exam to check the size of your prostate gland.  Blood and imaging tests.  A procedure to remove tissue samples from your prostate gland for testing (biopsy). What are the benefits of prostate cancer screening?  Screening can help to identify cancer at an early stage, before symptoms start and when the cancer can be treated more easily.  There is a small chance that screening may lower your risk of dying from prostate cancer. The chance is small because prostate cancer is a slow-growing cancer, and most men with prostate cancer die from a different cause. What are the risks of prostate cancer screening? The main risk of prostate cancer screening is diagnosing and treating prostate cancer that would never have caused any symptoms or problems. This is called overdiagnosisand overtreatment. PSA screening cannot tell you if your PSA is high due to cancer or a different cause. A prostate biopsy is the only procedure to diagnose prostate cancer. Even the results of a biopsy may not tell you if your cancer needs to be treated. Slow-growing prostate cancer may not need any treatment other than monitoring, so diagnosing and treating it may cause unnecessary stress or other side effects. A prostate biopsy may also cause:  Infection or fever.  A false negative. This is   a result that shows that you do not have prostate cancer when you actually do have prostate cancer. Questions to ask your health care provider  When should I start prostate cancer screening?  What is my risk for prostate cancer?  How often do I need screening?  What type of screening tests do I need?  How do I get my test results?  What do my results mean?  Do I need treatment? Where to find more information  The American Cancer  Society: www.cancer.org  American Urological Association: www.auanet.org Contact a health care provider if:  You have difficulty urinating.  You have pain when you urinate or ejaculate.  You have blood in your urine or semen.  You have pain in your back or in the area of your prostate. Summary  Prostate cancer is a common type of cancer in men. The prostate gland is located below the bladder and in front of the rectum. This gland adds fluid to semen during ejaculation.  Prostate cancer screening may identify cancer at an early stage, when the cancer can be treated more easily.  The prostate-specific antigen (PSA) test is the recommended screening test for prostate cancer.  Discuss the risks and benefits of prostate cancer screening with your health care provider. If you are age 70 or older, the risks that screening can cause are greater than the benefits that it may provide. This information is not intended to replace advice given to you by your health care provider. Make sure you discuss any questions you have with your health care provider. Document Revised: 06/07/2019 Document Reviewed: 06/07/2019 Elsevier Patient Education  2020 Elsevier Inc. Dietary Guidelines to Help Prevent Kidney Stones Kidney stones are deposits of minerals and salts that form inside your kidneys. Your risk of developing kidney stones may be greater depending on your diet, your lifestyle, the medicines you take, and whether you have certain medical conditions. Most people can reduce their chances of developing kidney stones by following the instructions below. Depending on your overall health and the type of kidney stones you tend to develop, your dietitian may give you more specific instructions. What are tips for following this plan? Reading food labels  Choose foods with "no salt added" or "low-salt" labels. Limit your sodium intake to less than 1500 mg per day.  Choose foods with calcium for each meal and  snack. Try to eat about 300 mg of calcium at each meal. Foods that contain 200-500 mg of calcium per serving include: ? 8 oz (237 ml) of milk, fortified nondairy milk, and fortified fruit juice. ? 8 oz (237 ml) of kefir, yogurt, and soy yogurt. ? 4 oz (118 ml) of tofu. ? 1 oz of cheese. ? 1 cup (300 g) of dried figs. ? 1 cup (91 g) of cooked broccoli. ? 1-3 oz can of sardines or mackerel.  Most people need 1000 to 1500 mg of calcium each day. Talk to your dietitian about how much calcium is recommended for you. Shopping  Buy plenty of fresh fruits and vegetables. Most people do not need to avoid fruits and vegetables, even if they contain nutrients that may contribute to kidney stones.  When shopping for convenience foods, choose: ? Whole pieces of fruit. ? Premade salads with dressing on the side. ? Low-fat fruit and yogurt smoothies.  Avoid buying frozen meals or prepared deli foods.  Look for foods with live cultures, such as yogurt and kefir. Cooking  Do not add salt to food when   cooking. Place a salt shaker on the table and allow each person to add his or her own salt to taste.  Use vegetable protein, such as beans, textured vegetable protein (TVP), or tofu instead of meat in pasta, casseroles, and soups. Meal planning   Eat less salt, if told by your dietitian. To do this: ? Avoid eating processed or premade food. ? Avoid eating fast food.  Eat less animal protein, including cheese, meat, poultry, or fish, if told by your dietitian. To do this: ? Limit the number of times you have meat, poultry, fish, or cheese each week. Eat a diet free of meat at least 2 days a week. ? Eat only one serving each day of meat, poultry, fish, or seafood. ? When you prepare animal protein, cut pieces into small portion sizes. For most meat and fish, one serving is about the size of one deck of cards.  Eat at least 5 servings of fresh fruits and vegetables each day. To do this: ? Keep fruits  and vegetables on hand for snacks. ? Eat 1 piece of fruit or a handful of berries with breakfast. ? Have a salad and fruit at lunch. ? Have two kinds of vegetables at dinner.  Limit foods that are high in a substance called oxalate. These include: ? Spinach. ? Rhubarb. ? Beets. ? Potato chips and french fries. ? Nuts.  If you regularly take a diuretic medicine, make sure to eat at least 1-2 fruits or vegetables high in potassium each day. These include: ? Avocado. ? Banana. ? Orange, prune, carrot, or tomato juice. ? Baked potato. ? Cabbage. ? Beans and split peas. General instructions   Drink enough fluid to keep your urine clear or pale yellow. This is the most important thing you can do.  Talk to your health care provider and dietitian about taking daily supplements. Depending on your health and the cause of your kidney stones, you may be advised: ? Not to take supplements with vitamin C. ? To take a calcium supplement. ? To take a daily probiotic supplement. ? To take other supplements such as magnesium, fish oil, or vitamin B6.  Take all medicines and supplements as told by your health care provider.  Limit alcohol intake to no more than 1 drink a day for nonpregnant women and 2 drinks a day for men. One drink equals 12 oz of beer, 5 oz of wine, or 1 oz of hard liquor.  Lose weight if told by your health care provider. Work with your dietitian to find strategies and an eating plan that works best for you. What foods are not recommended? Limit your intake of the following foods, or as told by your dietitian. Talk to your dietitian about specific foods you should avoid based on the type of kidney stones and your overall health. Grains Breads. Bagels. Rolls. Baked goods. Salted crackers. Cereal. Pasta. Vegetables Spinach. Rhubarb. Beets. Canned vegetables. Pickles. Olives. Meats and other protein foods Nuts. Nut butters. Large portions of meat, poultry, or fish. Salted or  cured meats. Deli meats. Hot dogs. Sausages. Dairy Cheese. Beverages Regular soft drinks. Regular vegetable juice. Seasonings and other foods Seasoning blends with salt. Salad dressings. Canned soups. Soy sauce. Ketchup. Barbecue sauce. Canned pasta sauce. Casseroles. Pizza. Lasagna. Frozen meals. Potato chips. French fries. Summary  You can reduce your risk of kidney stones by making changes to your diet.  The most important thing you can do is drink enough fluid. You should drink enough   fluid to keep your urine clear or pale yellow.  Ask your health care provider or dietitian how much protein from animal sources you should eat each day, and also how much salt and calcium you should have each day. This information is not intended to replace advice given to you by your health care provider. Make sure you discuss any questions you have with your health care provider. Document Revised: 02/14/2019 Document Reviewed: 10/05/2016 Elsevier Patient Education  2020 Elsevier Inc.  

## 2020-08-05 NOTE — Progress Notes (Signed)
° °  08/05/2020 10:22 AM   Emilee Hero 05/25/1961 [id number]  Reason for visit: Follow up nephrolithiasis, urinary symptoms, PSA screening  HPI: I saw Mr. Longton in urology clinic for follow-up of the above issues.  Briefly he is a 59 year old male with a family history of bladder and prostate cancer in his father who spontaneously passed a 4 mm left distal ureteral stone in August 2021.  There were no other renal stones seen on that CT.  He passed his stones in his symptoms resolved.  He then returned to the ED later that month with urinary urgency and frequency and some dysuria, however urinalysis was completely benign.  He is following up with me today to confirm clearance of microscopic hematuria after stone episode and urinary symptoms.  Urinalysis is completely benign today with no microscopic hematuria.  He denies any complaints today and has no flank pain or urinary symptoms, and denies any gross hematuria.  PSA has been normal in the past, and was last checked in August 2020 and was 0.66.  He continues to have this checked with his primary care physician on a yearly basis.  We again reviewed the importance of PSA screening with his family history, and he is in agreement to continue yearly screening with his PCP.  We reviewed the AUA guidelines regarding PSA screening, as well as the risks and benefits.  We discussed general stone prevention strategies including adequate hydration with goal of producing 2.5 L of urine daily, increasing citric acid intake, increasing calcium intake during high oxalate meals, minimizing animal protein, and decreasing salt intake. Information about dietary recommendations given today.   RTC as needed  Sondra Come, MD  [facility] [address] [place], [place] [address] [phone]

## 2020-09-10 ENCOUNTER — Telehealth: Payer: Self-pay

## 2020-09-10 NOTE — Telephone Encounter (Signed)
TC to Mr. Radu to provide results of rabies testing for cat exposed to over the weekend. Results negative; explained no indication for PEP.  Pt asked about scheduling immunization appointment to complete Hepatitis A and B vaccination series (Twinrix). Last vaccinated in 2018 before traveling to [place] and he would like receive follow up shots.   Verified in [facility] that patient requires both vaccines. Appt scheduled for Monday 09/15/2020  Vernelle Emerald, RN

## 2020-09-15 ENCOUNTER — Ambulatory Visit (LOCAL_COMMUNITY_HEALTH_CENTER): Payer: Federal, State, Local not specified - PPO

## 2020-09-15 ENCOUNTER — Other Ambulatory Visit: Payer: Self-pay

## 2020-09-15 DIAGNOSIS — Z23 Encounter for immunization: Secondary | ICD-10-CM

## 2020-09-15 NOTE — Progress Notes (Signed)
Pt to clinic for Twinrix #3 only.  Pt states he has already received the current influenza vaccine. Tdap recorded in Epic system added to historical section in [facility] record.

## 2020-12-11 ENCOUNTER — Other Ambulatory Visit: Payer: Self-pay

## 2020-12-11 ENCOUNTER — Ambulatory Visit
Admission: EM | Admit: 2020-12-11 | Discharge: 2020-12-11 | Disposition: A | Discharge status: Home / Self Care | Payer: Federal, State, Local not specified - PPO

## 2020-12-11 DIAGNOSIS — M62830 Muscle spasm of back: Secondary | ICD-10-CM

## 2020-12-11 DIAGNOSIS — M545 Low back pain, unspecified: Secondary | ICD-10-CM

## 2020-12-11 MED ORDER — NAPROXEN 500 MG PO TABS
500.0000 mg | ORAL_TABLET | Freq: Two times a day (BID) | ORAL | 0 refills | Status: DC
Start: 2020-12-11 — End: 2024-10-05

## 2020-12-11 MED ORDER — METAXALONE 800 MG PO TABS
800.0000 mg | ORAL_TABLET | Freq: Three times a day (TID) | ORAL | 0 refills | Status: DC
Start: 2020-12-11 — End: 2024-09-18

## 2020-12-11 NOTE — Discharge Instructions (Signed)
-  Naproxen: one tablet every 12 hours.  Don't take ibuprofen while taking this medication -Metaxalone: one tablet three ties a day as needed -can continue with Tylenol as needed -exercises as attached. -follow up with PCP as needed

## 2020-12-11 NOTE — ED Triage Notes (Signed)
Pt states he was doing exercises on Monday and awoke Tuesday morning with right low back pain. Endorses constant pain with some spasms. Pain starting to move down to buttocks and leg.

## 2020-12-11 NOTE — ED Provider Notes (Signed)
[facility]    CSN: [id number] Arrival date & time: 12/11/20  1034      History   Chief Complaint Chief Complaint  Patient presents with  . Back Pain    HPI Brian Waters is a 60 y.o. male.   Patient is a 60 year old male who presents with chief complaint of right lower back pain.  Patient states he was doing exercises Monday evening and when he woke up Tuesday morning notices back pain when try to get up.  Patient states he was doing "superman" poses and holding it while down on his hands and knees Monday night.  He states he has not done these in quite a while and may have overdone it.  Patient has any pain moving down his legs.  Reports mainly right lower back pain with spasming.  Patient states he is alternating Tylenol and ibuprofen which seem to take the edge off.  Patient states walking around seem to help 26 but down he has pain with getting back up.  He states he is worse with sitting down.  Patient does report allergies to codeine and morphine which cause rashes.     Past Medical History:  Diagnosis Date  . Coronary artery disease   . Hyperlipidemia   . Kidney stone   . Seasonal allergies     Patient Active Problem List   Diagnosis Date Noted  . Benign essential HTN 07/24/2019  . 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 Arteriosclerosis of coronary artery 02/18/2016  . NSTEMI (non-ST elevated myocardial infarction) (HCC) 02/02/2016  . Chest pain 01/31/2016  . AC (acromioclavicular) arthritis 10/23/2015  . Allergic rhinitis 07/24/2014    Past Surgical History:  Procedure Laterality Date  . CARDIAC CATHETERIZATION Bilateral 02/02/2016   Procedure: Left Heart Cath and Coronary Angiography;  Surgeon: Dalia Heading, MD;  Location: [facility] INVASIVE CV LAB;  Service: Cardiovascular;  Laterality: Bilateral;  . CARDIAC CATHETERIZATION N/A 02/02/2016   Procedure: Coronary Stent Intervention;  Surgeon: Alwyn Pea, MD;  Location:  [facility]VAS[facility] CV [facility];  Service: Cardiovascular;  Laterality: N/A;  . CORONARY ANGIOPLASTY WITH STENT PLACEMENT    . CYST EXCISION    . HERNIA REPAIR    . SHOULDER ARTHROSCOPY         Home Medications    Prior to Admission medications   Medication Sig Start Date End Date Taking? Authorizing Provider  metaxalone (SKELAXIN) 800 MG tablet Take 1 tablet (800 mg total) by mouth 3 (three) times daily. 12/11/20  Yes Candis Schatz, PA-C  montelukast (SINGULAIR) 10 MG tablet Take by mouth. 05/19/20  Yes [provider]  naproxen (NAPROSYN) 500 MG tablet Take 1 tablet (500 mg total) by mouth 2 (two) times daily. 12/11/20  Yes Candis Schatz, PA-C  aspirin EC 81 MG tablet Take 1 tablet (81 mg total) by mouth daily. 02/03/16   Houston Siren, MD  atorvastatin (LIPITOR) 40 MG tablet Take 1 tablet (40 mg total) by mouth daily at 6 PM. 02/03/16   Sainani, Rolly Pancake, MD  EPINEPHrine 0.3 mg/0.3 mL IJ SOAJ injection Inject into the muscle as directed. 06/27/20   [provider]  fluticasone (FLONASE) 50 MCG/ACT nasal spray Place 2 sprays into both nostrils daily.     [provider]  loratadine (CLARITIN) 10 MG tablet Take 10 mg by mouth daily.    [provider]  metoprolol tartrate (LOPRESSOR) 25 MG tablet Take 0.5 tablets (12.5 mg total) by mouth  2 (two) times daily. 02/03/16   Houston Siren, MD  Multiple Vitamin (MULTIVITAMIN) tablet Take 1 tablet by mouth daily.    [provider]  valsartan (DIOVAN) 40 MG tablet Take 40 mg by mouth daily. 07/26/20   [provider]    Family History Family History  Problem Relation Age of Onset  . CAD Father        CABG x 3  . Prostate cancer Father   . Bladder Cancer Father   . Hyperlipidemia Father   . Other Mother        unknow medical history    Social History Social History   Tobacco Use  . Smoking status: Never Smoker  . Smokeless tobacco: Never Used  Vaping Use  . Vaping Use: Never used   Substance Use Topics  . Alcohol use: Yes    Alcohol/week: 7.0 standard drinks    Types: 7 Glasses of wine per week    Comment: social  . Drug use: Never     Allergies   Morphine and related, Amoxicillin, Ibuprofen, Codeine, and Penicillins   Review of Systems Review of Systems as noted above, other systems reviewed and found to be negative.   Physical Exam Triage Vital Signs ED Triage Vitals  Enc Vitals Group     BP 12/11/20 1109 119/82     Pulse Rate 12/11/20 1109 75     Resp 12/11/20 1109 17     Temp 12/11/20 1109 98.3 F (36.8 C)     Temp Source 12/11/20 1109 Oral     SpO2 12/11/20 1109 100 %     Weight 12/11/20 1106 198 lb (89.8 kg)     Height 12/11/20 1106 5\' 9"  (1.753 m)     Head Circumference --      Peak Flow --      Pain Score 12/11/20 1106 6     Pain Loc --      Pain Edu? --      Excl. in GC? --    No data found.  Updated Vital Signs BP 119/82 (BP Location: Left Arm)   Pulse 75   Temp 98.3 F (36.8 C) (Oral)   Resp 17   Ht 5\' 9"  (1.753 m)   Wt 198 lb (89.8 kg)   SpO2 100%   BMI 29.24 kg/m   Physical Exam Constitutional:      Appearance: Normal appearance.  HENT:     Head: Normocephalic and atraumatic.  Cardiovascular:     Rate and Rhythm: Normal rate.  Musculoskeletal:     Lumbar back: Spasms and tenderness present. No swelling or deformity.       Back:     Comments: Slow, deliberate movements when moving to standing position.  Neurological:     General: No focal deficit present.     Mental Status: He is alert and oriented to person, place, and time.      UC Treatments / Results  Labs (all labs ordered are listed, but only abnormal results are displayed) Labs Reviewed - No data to display  EKG   Radiology No results found.  Procedures Procedures (including critical care time)  Medications Ordered in UC Medications - No data to display  Initial Impression / Assessment and Plan / UC Course  I have reviewed the triage  vital signs and the nursing notes.  Pertinent labs & imaging results that were available during my care of the patient were reviewed by me and considered in my medical  decision making (see chart for details).     Right low back pain, increased with palpation and forward flex. Slow deliberate movement with standing from chair. No radiculopathy reported.  Muscle spasms. Naproxen and Skelaxin. Rehab information.  Final Clinical Impressions(s) / UC Diagnoses   Final diagnoses:  Acute right-sided low back pain without sciatica  Muscle spasm of back     Discharge Instructions     -Naproxen: one tablet every 12 hours.  Don't take ibuprofen while taking this medication -Metaxalone: one tablet three ties a day as needed -can continue with Tylenol as needed -exercises as attached. -follow up with PCP as needed     ED Prescriptions    Medication Sig Dispense Auth. Provider   metaxalone (SKELAXIN) 800 MG tablet Take 1 tablet (800 mg total) by mouth 3 (three) times daily. 21 tablet Candis Schatz, PA-C   naproxen (NAPROSYN) 500 MG tablet Take 1 tablet (500 mg total) by mouth 2 (two) times daily. 30 tablet Candis Schatz, PA-C     PDMP not reviewed this encounter.   Candis Schatz, PA-C 12/11/20 1137

## 2021-01-27 ENCOUNTER — Other Ambulatory Visit: Payer: Self-pay | Admitting: Physical Medicine & Rehabilitation

## 2021-01-27 ENCOUNTER — Other Ambulatory Visit (HOSPITAL_COMMUNITY): Payer: Self-pay | Admitting: Physical Medicine & Rehabilitation

## 2021-01-27 DIAGNOSIS — R29898 Other symptoms and signs involving the musculoskeletal system: Secondary | ICD-10-CM

## 2021-01-27 DIAGNOSIS — M5441 Lumbago with sciatica, right side: Secondary | ICD-10-CM

## 2021-01-28 ENCOUNTER — Ambulatory Visit
Admission: RE | Admit: 2021-01-28 | Discharge: 2021-01-28 | Disposition: A | Discharge status: Home / Self Care | Payer: Federal, State, Local not specified - PPO | Source: Ambulatory Visit | Attending: Physical Medicine & Rehabilitation | Admitting: Physical Medicine & Rehabilitation

## 2021-01-28 ENCOUNTER — Other Ambulatory Visit: Payer: Self-pay

## 2021-01-28 DIAGNOSIS — M5441 Lumbago with sciatica, right side: Secondary | ICD-10-CM | POA: Diagnosis not present

## 2021-01-28 DIAGNOSIS — R29898 Other symptoms and signs involving the musculoskeletal system: Secondary | ICD-10-CM | POA: Insufficient documentation

## 2021-03-04 ENCOUNTER — Ambulatory Visit: Payer: Federal, State, Local not specified - PPO | Admitting: Physical Therapy

## 2021-03-09 ENCOUNTER — Ambulatory Visit: Payer: Federal, State, Local not specified - PPO | Admitting: Physical Therapy

## 2021-03-11 ENCOUNTER — Encounter: Payer: Federal, State, Local not specified - PPO | Admitting: Physical Therapy

## 2021-03-16 ENCOUNTER — Encounter: Payer: Federal, State, Local not specified - PPO | Admitting: Physical Therapy

## 2021-03-18 ENCOUNTER — Encounter: Payer: Federal, State, Local not specified - PPO | Admitting: Physical Therapy

## 2021-03-23 ENCOUNTER — Encounter: Payer: Federal, State, Local not specified - PPO | Admitting: Physical Therapy

## 2021-03-25 ENCOUNTER — Encounter: Payer: Federal, State, Local not specified - PPO | Admitting: Physical Therapy

## 2021-03-30 ENCOUNTER — Encounter: Payer: Federal, State, Local not specified - PPO | Admitting: Physical Therapy

## 2021-04-01 ENCOUNTER — Encounter: Payer: Federal, State, Local not specified - PPO | Admitting: Physical Therapy

## 2021-08-28 DIAGNOSIS — Z23 Encounter for immunization: Secondary | ICD-10-CM | POA: Diagnosis not present

## 2021-11-24 DIAGNOSIS — I700 Atherosclerosis of aorta: Secondary | ICD-10-CM | POA: Insufficient documentation

## 2022-01-12 DIAGNOSIS — I6523 Occlusion and stenosis of bilateral carotid arteries: Secondary | ICD-10-CM | POA: Insufficient documentation

## 2022-02-23 ENCOUNTER — Ambulatory Visit
Admission: EM | Admit: 2022-02-23 | Discharge: 2022-02-23 | Disposition: A | Discharge status: Home / Self Care | Payer: Federal, State, Local not specified - PPO | Attending: Internal Medicine | Admitting: Internal Medicine

## 2022-02-23 DIAGNOSIS — J209 Acute bronchitis, unspecified: Secondary | ICD-10-CM | POA: Diagnosis not present

## 2022-02-23 MED ORDER — AZITHROMYCIN 250 MG PO TABS: 250.0000 mg | ORAL_TABLET | Freq: Every day | ORAL | 0 refills | Status: AC

## 2022-02-23 MED ORDER — PREDNISONE 50 MG PO TABS: 50.0000 mg | ORAL_TABLET | Freq: Every day | ORAL | 0 refills | Status: DC

## 2022-02-23 NOTE — Discharge Instructions (Signed)
No danger signs on exam today.  Symptoms and exam today are consistent with acute bronchitis, which is typically viral. This should run its course in several days.  Prescriptions for prednisone (steroid) and zithromax (antibiotic) were sent to the pharmacy.  Push fluids and rest.  Take tylenol or advil otc as needed for fever, discomfort.  Eat fruits and vegetables to help your immune system do its best work.  Anticipate gradual improvement over the next several days.  Recheck for new fever >100.5, increasing phlegm production/nasal discharge, or if not starting to improve in a few days.    ?

## 2022-02-23 NOTE — ED Provider Notes (Signed)
?[place] ? ? ? ?CSN: [id number] ?Arrival date & time: 02/23/22  [id number] ? ? ?  ? ?History   ?Chief Complaint ?Chief Complaint  ?Patient presents with  ? Cough  ? Nasal Congestion  ? ? ?HPI ?HORICE BORSETH is a 61 y.o. male. He presents today with onset of a cough on 4/14, with sore throat and right ear feeling stopped up on 4/15.  Lots of coughing, productive of green phlegm, and chest tightness.  No fever.  Not much runny nose; takes allergy shots and is on allergy medicines.  Not vomiting, no diarrhea.  Some malaise.  Home covid test was negative.  Interested in a zpack.   ? ? ?Cough ? ?Past Medical History:  ?Diagnosis Date  ? Coronary artery disease   ? Hyperlipidemia   ? Kidney stone   ? Seasonal allergies   ? ? ?Patient Active Problem List  ? Diagnosis Date Noted  ? Benign essential HTN 07/24/2019  ? Combined fat and carbohydrate induced hyperlipemia 02/18/2016  ? Arteriosclerosis of coronary artery 02/18/2016  ? NSTEMI (non-ST elevated myocardial infarction) ([place]) 02/02/2016  ? Chest pain 01/31/2016  ? AC (acromioclavicular) arthritis 10/23/2015  ? Allergic rhinitis 07/24/2014  ? ? ?Past Surgical History:  ?Procedure Laterality Date  ? CARDIAC CATHETERIZATION Bilateral 02/02/2016  ? Procedure: Left Heart Cath and Coronary Angiography;  Surgeon: Teodoro Spray, MD;  Location: [facility];  Service: Cardiovascular;  Laterality: Bilateral;  ? CARDIAC CATHETERIZATION N/A 02/02/2016  ? Procedure: Coronary Stent Intervention;  Surgeon: Yolonda Kida, MD;  Location: [facility];  Service: Cardiovascular;  Laterality: N/A;  ? CORONARY ANGIOPLASTY WITH STENT PLACEMENT    ? CYST EXCISION    ? HERNIA REPAIR    ? SHOULDER ARTHROSCOPY    ? ? ? ? ? ?Home Medications   ? ?Prior to Admission medications   ?Medication Sig Start Date End Date Taking? Authorizing Provider  ?aspirin EC 81 MG tablet Take 1 tablet (81 mg total) by mouth daily. 02/03/16  Yes Henreitta Leber, MD  ?atorvastatin  (LIPITOR) 40 MG tablet Take 1 tablet (40 mg total) by mouth daily at 6 PM. 02/03/16  Yes Sainani, Belia Heman, MD  ?azithromycin (ZITHROMAX Z-PAK) 250 MG tablet Take 1 tablet (250 mg total) by mouth daily for 5 doses. Take 2 tablets on first day, then 1 tablet daily after that until gone 02/23/22 02/28/22 Yes Wynona Luna, MD  ?EPINEPHrine 0.3 mg/0.3 mL IJ SOAJ injection Inject into the muscle as directed. 06/27/20  Yes [provider]  ?fluticasone (FLONASE) 50 MCG/ACT nasal spray Place 2 sprays into both nostrils daily.    Yes [provider]  ?loratadine (CLARITIN) 10 MG tablet Take 10 mg by mouth daily.   Yes [provider]  ?metaxalone (SKELAXIN) 800 MG tablet Take 1 tablet (800 mg total) by mouth 3 (three) times daily. 12/11/20  Yes Luvenia Redden, PA-C  ?montelukast (SINGULAIR) 10 MG tablet Take by mouth. 05/19/20  Yes [provider]  ?Multiple Vitamin (MULTIVITAMIN) tablet Take 1 tablet by mouth daily.   Yes [provider]  ?naproxen (NAPROSYN) 500 MG tablet Take 1 tablet (500 mg total) by mouth 2 (two) times daily. 12/11/20  Yes Luvenia Redden, PA-C  ?predniSONE (DELTASONE) 50 MG tablet Take 1 tablet (50 mg total) by mouth daily. 02/23/22  Yes Wynona Luna, MD  ?valsartan (DIOVAN) 40 MG tablet Take 40 mg by mouth daily. 07/26/20  Yes  [provider]  ? ? ?Family History ?Family History  ?Problem Relation Age of Onset  ? CAD Father   ?     CABG x 3  ? Prostate cancer Father   ? Bladder Cancer Father   ? Hyperlipidemia Father   ? Other Mother   ?     unknow medical history  ? ? ?Social History ?Social History  ? ?Tobacco Use  ? Smoking status: Never  ? Smokeless tobacco: Never  ?Vaping Use  ? Vaping Use: Never used  ?Substance Use Topics  ? Alcohol use: Yes  ?  Alcohol/week: 7.0 standard drinks  ?  Types: 7 Glasses of wine per week  ?  Comment: social  ? Drug use: Never  ? ? ? ?Allergies   ?Morphine and related, Amoxicillin, Ibuprofen, Codeine,  and Penicillins ? ? ?Review of Systems ?Review of Systems  ?Respiratory:  Positive for cough.    See HPI ? ? ?Physical Exam ?Triage Vital Signs ?ED Triage Vitals  ?Enc Vitals Group  ?   BP 02/23/22 1101 120/87  ?   Pulse Rate 02/23/22 1101 78  ?   Resp 02/23/22 1101 18  ?   Temp 02/23/22 1101 98.7 ?F (37.1 ?C)  ?   Temp Source 02/23/22 1101 Oral  ?   SpO2 02/23/22 1101 100 %  ?   Weight 02/23/22 1059 198 lb (89.8 kg)  ?   Height 02/23/22 1059 5\' 9"  (1.753 m)  ?   Pain Score 02/23/22 1058 0  ?   Pain Loc --   ? ? ?Updated Vital Signs ?BP 120/87 (BP Location: Left Arm)   Pulse 78   Temp 98.7 ?F (37.1 ?C) (Oral)   Resp 18   Ht 5\' 9"  (1.753 m)   Wt 89.8 kg   SpO2 100%   BMI 29.24 kg/m?  ? ?Physical Exam ?Constitutional:   ?   General: He is not in acute distress. ?   Appearance: He is not ill-appearing or toxic-appearing.  ?   Comments: Good hygiene.  Some coughing during exam.  ?HENT:  ?   Head: Atraumatic.  ?   Comments: B TMs mildly dull, no erythema ?Marked nasal congestion bilaterally ?Posterior pharynx mild-mod injection ?   Mouth/Throat:  ?   Mouth: Mucous membranes are moist.  ?Eyes:  ?   Conjunctiva/sclera:  ?   Right eye: Right conjunctiva is not injected. No exudate. ?   Left eye: Left conjunctiva is not injected. No exudate. ?   Comments: Conjugate gaze observed  ?Cardiovascular:  ?   Rate and Rhythm: Normal rate and regular rhythm.  ?Pulmonary:  ?   Effort: Pulmonary effort is normal. No respiratory distress.  ?   Breath sounds: No wheezing or rhonchi.  ?   Comments: Diffuse mild coarseness on exam, nonfocal ?Abdominal:  ?   General: There is no distension.  ?Musculoskeletal:  ?   Cervical back: Neck supple.  ?   Comments: Walked into urgent care independently  ?Skin: ?   General: Skin is warm and dry.  ?   Comments: Not cyanotic  ?Neurological:  ?   Mental Status: He is alert.  ?   Comments: Face symmetric, speech clear/coherent/logical  ? ? ? ?UC Treatments / Results  ?Labs ?(all labs ordered are  listed, but only abnormal results are displayed) ?Labs Reviewed - No data to display ?NA ? ?EKG ?NA ? ?Radiology ?No results found. ?NA ? ?Procedures ?Procedures (including critical care time) ?NA ? ?  Medications Ordered in UC ?Medications - No data to display ?NA ? ?Final Clinical Impressions(s) / UC Diagnoses  ? ?Final diagnoses:  ?Acute bronchitis, unspecified organism  ? ? ? ?Discharge Instructions   ? ?  ?No danger signs on exam today.  Symptoms and exam today are consistent with acute bronchitis, which is typically viral. This should run its course in several days.  Prescriptions for prednisone (steroid) and zithromax (antibiotic) were sent to the pharmacy.  Push fluids and rest.  Take tylenol or advil otc as needed for fever, discomfort.  Eat fruits and vegetables to help your immune system do its best work.  Anticipate gradual improvement over the next several days.  Recheck for new fever >100.5, increasing phlegm production/nasal discharge, or if not starting to improve in a few days.    ? ? ?ED Prescriptions   ? ? Medication Sig Dispense Auth. Provider  ? predniSONE (DELTASONE) 50 MG tablet Take 1 tablet (50 mg total) by mouth daily. 3 tablet Wynona Luna, MD  ? azithromycin (ZITHROMAX Z-PAK) 250 MG tablet Take 1 tablet (250 mg total) by mouth daily for 5 doses. Take 2 tablets on first day, then 1 tablet daily after that until gone 6 tablet Valere Dross Jadene Pierini, MD  ? ?  ? ?PDMP not reviewed this encounter. ?  ?Wynona Luna, MD ?02/24/22 1440 ? ?

## 2022-02-23 NOTE — ED Triage Notes (Signed)
Pt c/o right ear being "stopped up", sore throat, cough with green phlegm, chest congestion x5days.  ?

## 2022-06-13 IMAGING — CT CT ABD-PELV W/ CM
2 of 5 series · 16 of 46 positions shown, 18 images · IV contrast (APPLIED)
Comparison: None.

CLINICAL DATA: Abdominal pain described as acute left lower
quadrant pain.

EXAM:
CT ABDOMEN AND PELVIS WITH CONTRAST
TECHNIQUE: Multidetector CT imaging of the abdomen and pelvis was performed
using the standard protocol following bolus administration of
intravenous contrast.
CONTRAST:  100mL OMNIPAQUE IOHEXOL 300 MG/ML  SOLN

[Series 2: routine abd/pel with · axial · 0.82mm/px · z∈[-958,-494]mm · 13 of 105 slices shown, 15 images]
[im 6/105  soft-tissue]
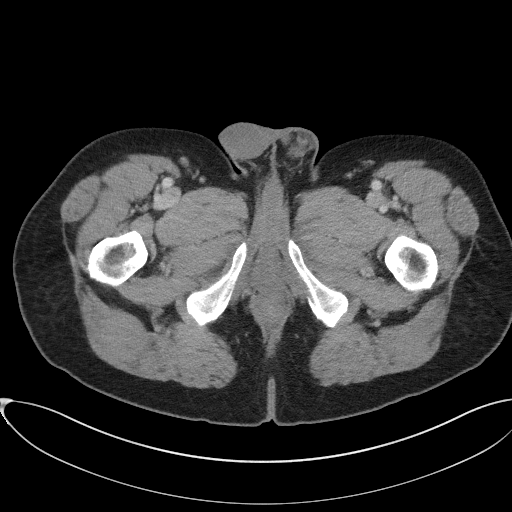
[im 6/105  bone]
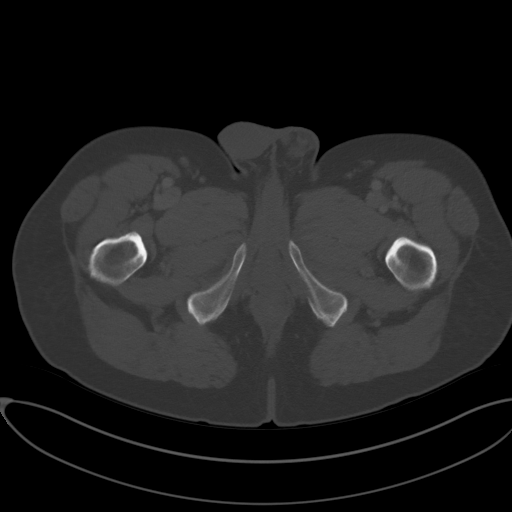
[im 12/105  soft-tissue]
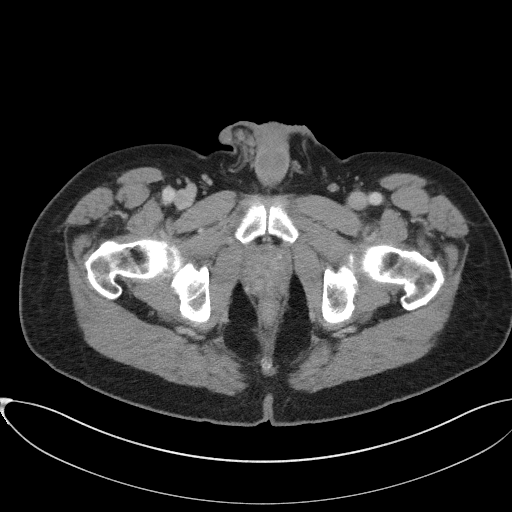
[im 24/105  soft-tissue]
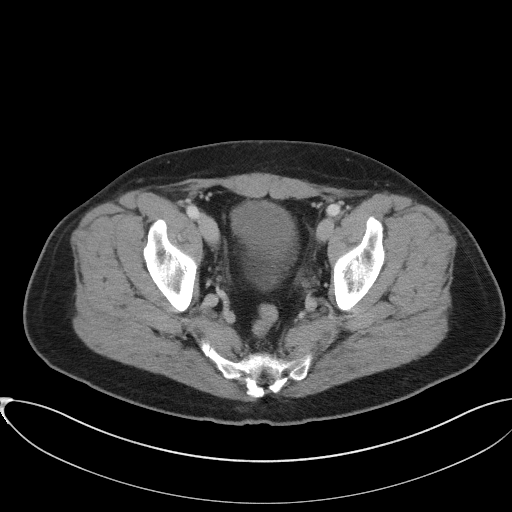
[im 29/105  soft-tissue]
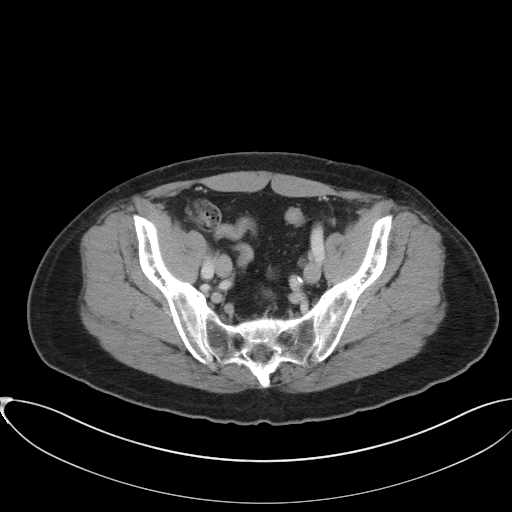
[im 35/105  soft-tissue]
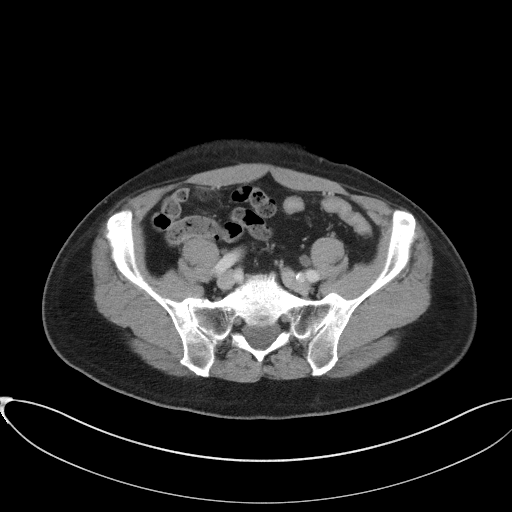
[im 47/105  soft-tissue]
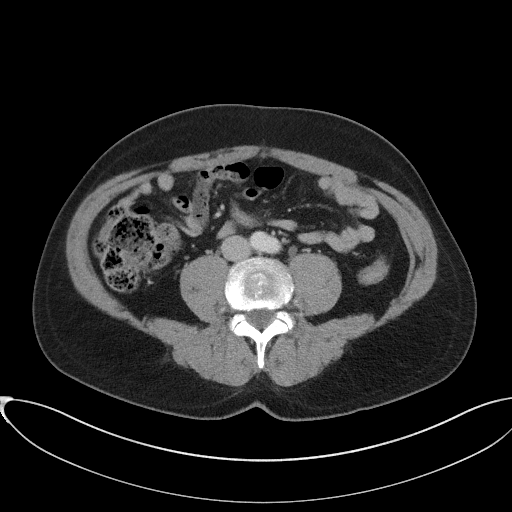
[im 53/105  soft-tissue]
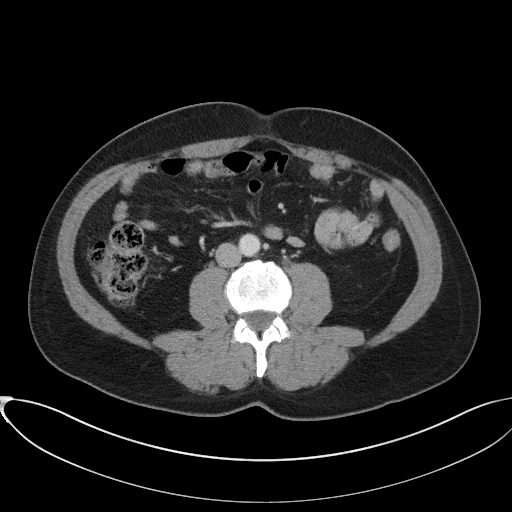
[im 58/105  soft-tissue]
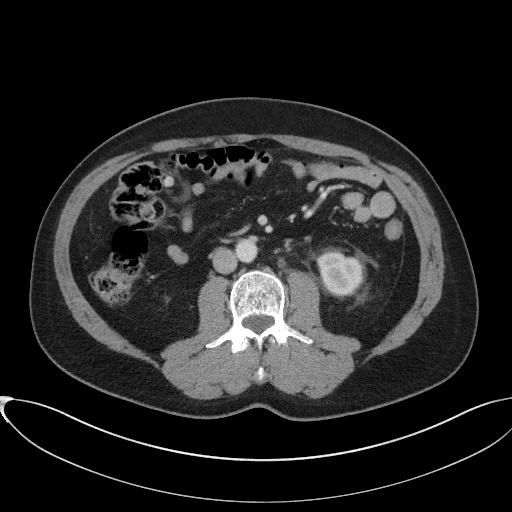
[im 70/105  soft-tissue]
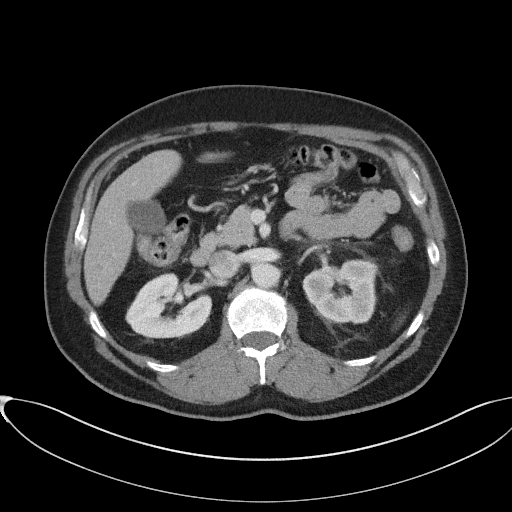
[im 70/105  bone]
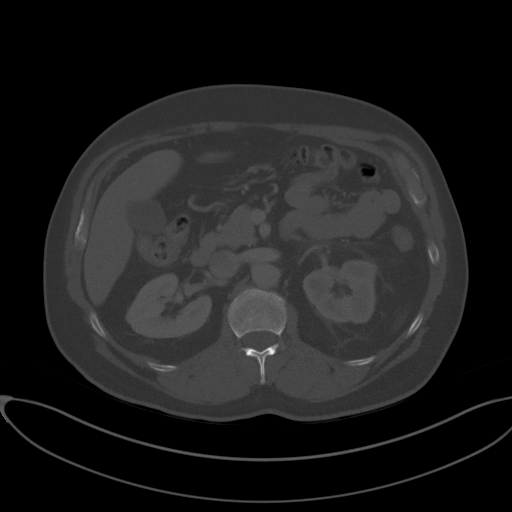
[im 76/105  soft-tissue]
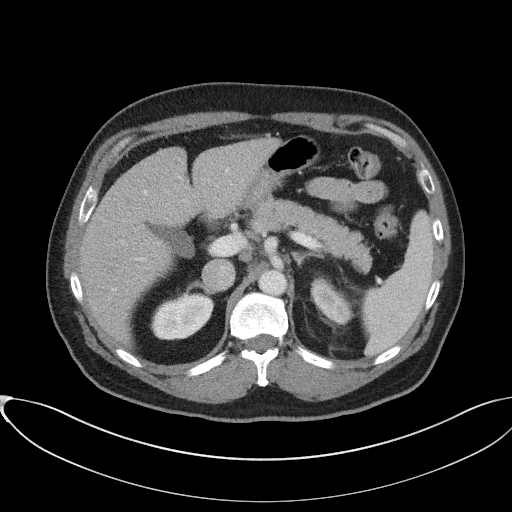
[im 81/105  soft-tissue]
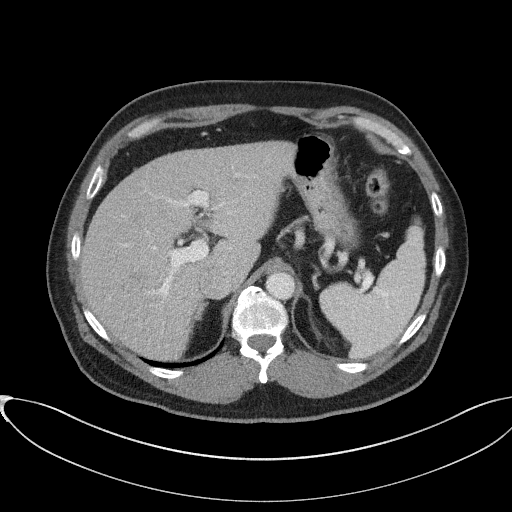
[im 93/105  soft-tissue]
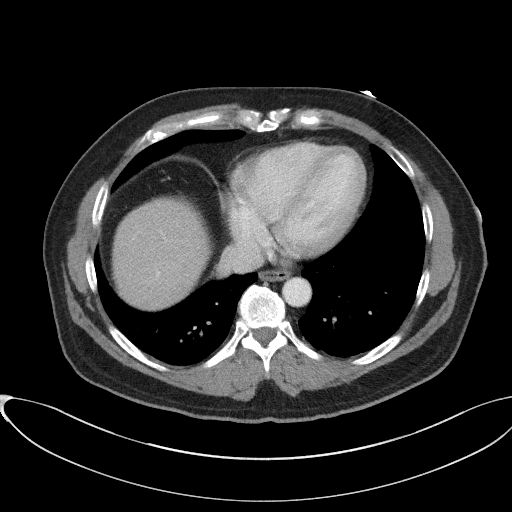
[im 99/105  soft-tissue]
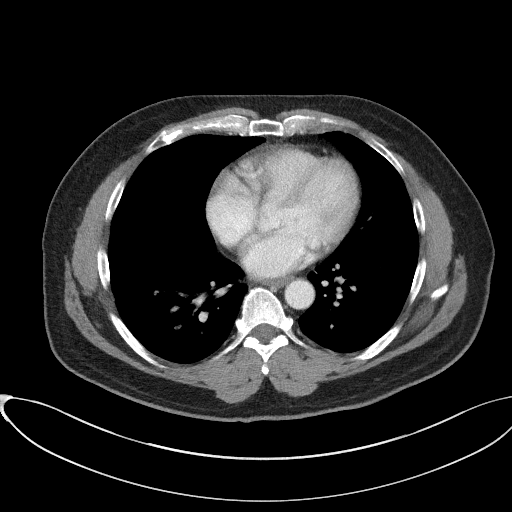

[Series 5: coronal st · coronal · 0.81mm/px · 3 of 96 slices shown]
[im 32/96  soft-tissue]
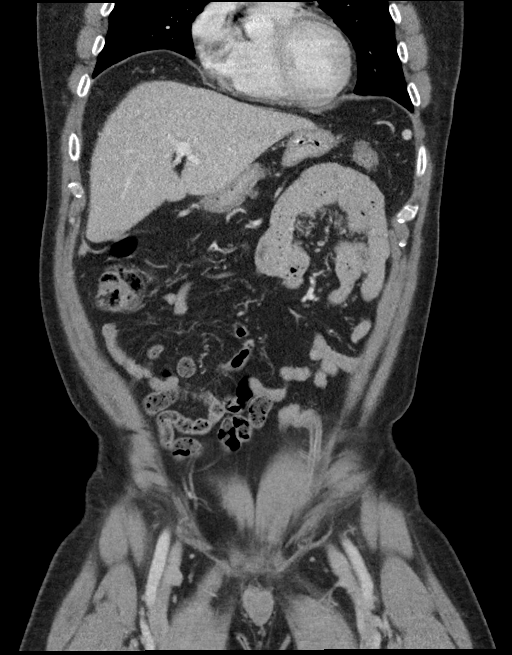
[im 43/96  soft-tissue]
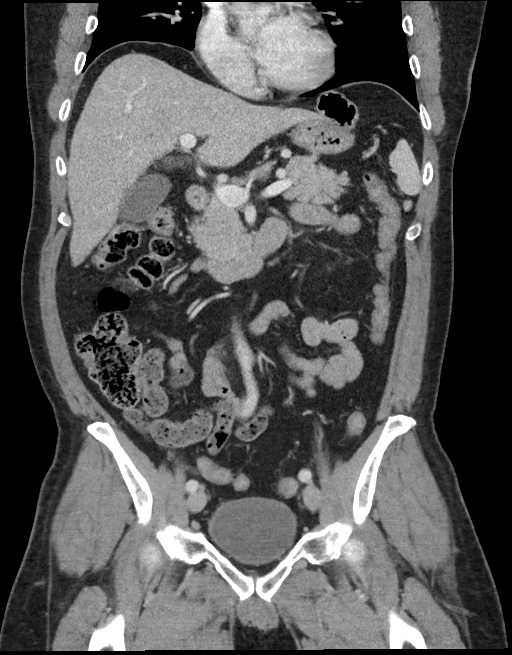
[im 53/96  soft-tissue]
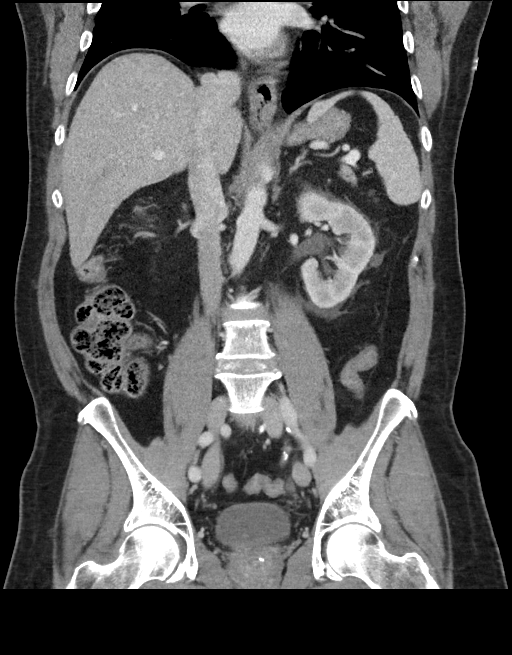

[16 of 46 positions shown; findings below may reference images not displayed]

FINDINGS: Lower chest: No acute abnormality.

Hepatobiliary: No focal liver abnormality is seen. No gallstones,
gallbladder wall thickening, or biliary dilatation.

Pancreas: Unremarkable. No pancreatic ductal dilatation or
surrounding inflammatory changes.

Spleen: Normal in size without focal abnormality.

Adrenals/Urinary Tract: No adrenal masses.

There is left perinephric stranding, with a mild delay and left
renal enhancement and excretion. Mild left hydronephrosis and
hydroureter. These findings are due to a 3-4 mm distal left ureteral
stone approximately 1 cm above the ureterovesicular junction.

No other ureteral stones. No right hydronephrosis or ureteral
dilation. No renal masses. No intrarenal stones. Bladder is
unremarkable.

Stomach/Bowel: Stomach is within normal limits. Appendix appears
normal. No evidence of bowel wall thickening, distention, or
inflammatory changes.

Vascular/Lymphatic: Normal aorta. Common iliac artery left internal
iliac artery atherosclerotic calcifications. No stenosis. No
enlarged lymph nodes.

Reproductive: Unremarkable.

Other: No abdominal wall hernia or abnormality. No abdominopelvic
ascites.

Musculoskeletal: No acute or significant osseous findings.
IMPRESSION: 1. 3-4 mm stone in the distal left ureter causes mild left
hydroureteronephrosis.
2. No other acute abnormality.
3. No intrarenal stones.

## 2022-12-14 ENCOUNTER — Other Ambulatory Visit: Payer: Self-pay

## 2022-12-14 ENCOUNTER — Emergency Department: Payer: Federal, State, Local not specified - PPO

## 2022-12-14 ENCOUNTER — Encounter: Payer: Self-pay | Admitting: Emergency Medicine

## 2022-12-14 ENCOUNTER — Emergency Department
Admission: EM | Admit: 2022-12-14 | Discharge: 2022-12-14 | Disposition: A | Discharge status: Home / Self Care | Payer: Federal, State, Local not specified - PPO | Attending: Emergency Medicine | Admitting: Emergency Medicine

## 2022-12-14 DIAGNOSIS — R519 Headache, unspecified: Secondary | ICD-10-CM | POA: Diagnosis present

## 2022-12-14 DIAGNOSIS — I159 Secondary hypertension, unspecified: Secondary | ICD-10-CM | POA: Insufficient documentation

## 2022-12-14 DIAGNOSIS — Z1152 Encounter for screening for COVID-19: Secondary | ICD-10-CM | POA: Diagnosis not present

## 2022-12-14 LAB — CBC WITH DIFFERENTIAL/PLATELET
Abs Immature Granulocytes: 0.02 10*3/uL (ref 0.00–0.07)
Basophils Absolute: 0.1 10*3/uL (ref 0.0–0.1)
Basophils Relative: 1 %
Eosinophils Absolute: 0.1 10*3/uL (ref 0.0–0.5)
Eosinophils Relative: 2 %
HCT: 42.4 % (ref 39.0–52.0)
Hemoglobin: 14.2 g/dL (ref 13.0–17.0)
Immature Granulocytes: 0 %
Lymphocytes Relative: 19 %
Lymphs Abs: 1.6 10*3/uL (ref 0.7–4.0)
MCH: 31.1 pg (ref 26.0–34.0)
MCHC: 33.5 g/dL (ref 30.0–36.0)
MCV: 92.8 fL (ref 80.0–100.0)
Monocytes Absolute: 0.5 10*3/uL (ref 0.1–1.0)
Monocytes Relative: 6 %
Neutro Abs: 6.3 10*3/uL (ref 1.7–7.7)
Neutrophils Relative %: 72 %
Platelets: 253 10*3/uL (ref 150–400)
RBC: 4.57 MIL/uL (ref 4.22–5.81)
RDW: 12.5 % (ref 11.5–15.5)
WBC: 8.6 10*3/uL (ref 4.0–10.5)
nRBC: 0 % (ref 0.0–0.2)

## 2022-12-14 LAB — BASIC METABOLIC PANEL
Anion gap: 12 (ref 5–15)
BUN: 14 mg/dL (ref 8–23)
CO2: 25 mmol/L (ref 22–32)
Calcium: 9 mg/dL (ref 8.9–10.3)
Chloride: 103 mmol/L (ref 98–111)
Creatinine, Ser: 0.81 mg/dL (ref 0.61–1.24)
GFR, Estimated: >60 mL/min (ref >60)
Glucose, Bld: 91 mg/dL (ref 70–99)
Potassium: 3.9 mmol/L (ref 3.5–5.1)
Sodium: 140 mmol/L (ref 135–145)

## 2022-12-14 LAB — RESP PANEL BY RT-PCR (RSV, FLU A&B, COVID)  RVPGX2
Influenza A by PCR: NEGATIVE
Influenza B by PCR: NEGATIVE
Resp Syncytial Virus by PCR: NEGATIVE
SARS Coronavirus 2 by RT PCR: NEGATIVE

## 2022-12-14 LAB — BRAIN NATRIURETIC PEPTIDE: B Natriuretic Peptide: 20.5 pg/mL (ref 0.0–100.0)

## 2022-12-14 LAB — TROPONIN I (HIGH SENSITIVITY): Troponin I (High Sensitivity): <2 ng/L (ref <18)

## 2022-12-14 NOTE — Discharge Instructions (Signed)
Your exam, labs, EKG, and CT scan all normal and reassuring at this time.  You should follow-up with your primary provider or cardiologist for ongoing management.  Consider increasing your blood pressure medication as recommended.  Monitor and track blood pressure readings in the interim.  Return to the ED for worsening headache elevated blood pressure as discussed.

## 2022-12-14 NOTE — ED Notes (Signed)
Pt signed printed d/c paperwork.  

## 2022-12-14 NOTE — ED Provider Notes (Signed)
[facility] Emergency Department Provider Note     Event Date/Time   First MD Initiated Contact with Patient 12/14/22 1134     (approximate)   History   Hypertension and Headache   HPI  Brian Waters is a 62 y.o. male history of NSTEMI, allergic rhinitis, hypertension, and HLD presents to the ED for evaluation of 2 days of intermittent headache.  Patient also notes associated elevated blood pressure readings since that time.  He notes systolic readings in the 244W and diastolic readings in the high 90s.  His typical readings are 120s over 60s.  Patient denies any frank chest pain or shortness of breath, he does note some mild blurry vision but denies any weakness, paresthesias, or slurred speech.  He takes losartan nightly for blood pressure and a baby aspirin daily.  He denies any vertigo, dizziness, tinnitus, or syncope.  He also denies any recent head injury or trauma.  He denies any disruption to his normal ADLs as well as his exercise activity with the reported elevated blood pressure and headache.  He notes headache pain at a 3 out of 10 at this time, and at worst was a 6 out of 10.  Denies any need for pain medicines to manage his pain.  He notes an incidentally noted about 5 lb weight gain noted at his pre-op visit ahead of his screen ing colonoscopy.  He made telephone contact with his PCP, suggested he crease his BP medicine to twice daily, and report to the ED for further evaluation.   Physical Exam   Triage Vital Signs: ED Triage Vitals  Enc Vitals Group     BP 12/14/22 1109 (!) 158/97     Pulse Rate 12/14/22 1109 77     Resp 12/14/22 1109 18     Temp 12/14/22 1109 98.6 F (37 C)     Temp Source 12/14/22 1109 Oral     SpO2 12/14/22 1109 100 %     Weight 12/14/22 1110 197 lb 15.6 oz (89.8 kg)     Height 12/14/22 1110 5\' 9"  (1.753 m)     Head Circumference --      Peak Flow --      Pain Score 12/14/22 1109 5     Pain Loc --      Pain Edu?  --      Excl. in [place]? --     Most recent vital signs: Vitals:   12/14/22 1213 12/14/22 1434  BP: (!) 156/89 (!) 151/94  Pulse: 78 81  Resp: 18 17  Temp:    SpO2: 98% 100%    General Awake, no distress. NAD HEENT NCAT. PERRL. EOMI. No rhinorrhea. Mucous membranes are moist.  CV:  Good peripheral perfusion.  RESP:  Normal effort.  ABD:  No distention.    ED Results / Procedures / Treatments   Labs (all labs ordered are listed, but only abnormal results are displayed) Labs Reviewed  RESP PANEL BY RT-PCR (RSV, FLU A&B, COVID)  [id number]  CBC WITH DIFFERENTIAL/PLATELET  BASIC METABOLIC PANEL  BRAIN NATRIURETIC PEPTIDE  TROPONIN I (HIGH SENSITIVITY)    EKG  Normal sinus rhythm Normal ECG Normal axis No ST changes   RADIOLOGY  I personally viewed and evaluated these images as part of my medical decision making, as well as reviewing the written report by the radiologist.  ED Provider Interpretation: no acute findings  CT HEAD WO CONTRAST (5MM)  Result Date: 12/14/2022 CLINICAL DATA:  Headache. EXAM: CT HEAD WITHOUT CONTRAST TECHNIQUE: Contiguous axial images were obtained from the base of the skull through the vertex without intravenous contrast. RADIATION DOSE REDUCTION: This exam was performed according to the departmental dose-optimization program which includes automated exposure control, adjustment of the mA and/or kV according to patient size and/or use of iterative reconstruction technique. COMPARISON:  None Available. FINDINGS: Brain: No evidence of acute infarction, hemorrhage, hydrocephalus, extra-axial collection or mass lesion/mass effect. Vascular: No hyperdense vessel or unexpected calcification. Skull: Normal. Negative for fracture or focal lesion. Sinuses/Orbits: No acute finding. Other: None. IMPRESSION: No acute intracranial pathology. Electronically Signed   By: Fidela Salisbury M.D.   On: 12/14/2022 12:51     PROCEDURES:  Critical Care performed:  No  Procedures   MEDICATIONS ORDERED IN ED: Medications - No data to display   IMPRESSION / MDM / [place] / ED COURSE  I reviewed the triage vital signs and the nursing notes.                              Differential diagnosis includes, but is not limited to, intracranial hemorrhage, meningitis/encephalitis, previous head trauma, cavernous venous thrombosis, tension headache, temporal arteritis, migraine or migraine equivalent, idiopathic intracranial hypertension, and non-specific headache.   Patient's presentation is most consistent with acute complicated illness / injury requiring diagnostic workup.  Patient's diagnosis is consistent with nonspecific chest pain, non-intractable headache as well as elevated BP.  Patient with no evidence of acute MI given his normal EKG.  No evidence of malignant arrhythmia or ST changes.  Patient's labs are reassuring as it shows no leukocytosis, no abnormalities of electrolytes, and a normal troponin.  Viral panel test is also negative, and his BNP is within normal range.  CT exam of the head without acute abnormality based on my interpretation. Patient will be discharged home with instructions to follow-up with primary provider or cardiologist for further management of his blood pressure.  He is encouraged to increase his dose of losartan at this time.  Patient should also monitor his blood pressure readings in the interim.. Patient is given ED precautions to return to the ED for any worsening or new symptoms.  FINAL CLINICAL IMPRESSION(S) / ED DIAGNOSES   Final diagnoses:  Secondary hypertension  Acute nonintractable headache, unspecified headache type     Rx / DC Orders   ED Discharge Orders     None        Note:  This document was prepared using Dragon voice recognition software and may include unintentional dictation errors.    Melvenia Needles, PA-C 12/14/22 1605    Harvest Dark, MD 12/15/22 2016

## 2022-12-14 NOTE — ED Notes (Signed)
[employer] nursing student attempted for 20g IV x1.

## 2022-12-14 NOTE — ED Triage Notes (Signed)
Pt here with hypertension and a headache that started Sat. Pt states his bp at home was consistently at 164/99. Pt called his cardiology who advised him to take more of his bp medication and come to the ED.

## 2022-12-14 NOTE — ED Notes (Signed)
Brian Waters NT states will collect blood and swab soon; provider currently updating pt/family.

## 2022-12-14 NOTE — ED Notes (Signed)
Called lab to add on BNP. Staff states they will do so now.

## 2022-12-14 NOTE — ED Notes (Signed)
Pt back from imaging. Pt in NAD. Visitor remains with pt.

## 2022-12-14 NOTE — ED Notes (Signed)
See triage note. Pt alert, skin dry, resp reg/unlabored, confirms HA; speech clear, steady upon ambulation; denies CP/SOB/weakness.

## 2022-12-14 NOTE — ED Notes (Addendum)
This RN attempted for 20g IV x2; once at lateral ac and once at lower fa. Will have NT complete butterfly stick.

## 2022-12-14 NOTE — ED Notes (Signed)
Blood collected and sent to lab by Shawn, NT.

## 2022-12-14 NOTE — ED Notes (Signed)
Swab collected by Theadora Rama, [employer].

## 2022-12-14 NOTE — ED Notes (Signed)
Called lab to add on troponin. Staff states they will do so now.

## 2022-12-14 NOTE — ED Notes (Signed)
Pt resting in recliner; in NAD; visitor remains with pt.

## 2022-12-14 NOTE — ED Notes (Signed)
Brian Waters NT/medic states will butterfly stick soon for blood.

## 2022-12-15 DIAGNOSIS — R030 Elevated blood-pressure reading, without diagnosis of hypertension: Secondary | ICD-10-CM | POA: Insufficient documentation

## 2023-01-13 ENCOUNTER — Ambulatory Visit: Payer: Federal, State, Local not specified - PPO

## 2023-01-13 DIAGNOSIS — K640 First degree hemorrhoids: Secondary | ICD-10-CM | POA: Diagnosis not present

## 2023-01-13 DIAGNOSIS — K5140 Inflammatory polyps of colon without complications: Secondary | ICD-10-CM | POA: Diagnosis not present

## 2023-01-13 DIAGNOSIS — Z1211 Encounter for screening for malignant neoplasm of colon: Secondary | ICD-10-CM | POA: Diagnosis present

## 2023-07-28 ENCOUNTER — Ambulatory Visit: Payer: Federal, State, Local not specified - PPO

## 2023-07-28 DIAGNOSIS — D122 Benign neoplasm of ascending colon: Secondary | ICD-10-CM | POA: Diagnosis not present

## 2023-07-28 DIAGNOSIS — K640 First degree hemorrhoids: Secondary | ICD-10-CM | POA: Diagnosis not present

## 2023-07-28 DIAGNOSIS — Z1211 Encounter for screening for malignant neoplasm of colon: Secondary | ICD-10-CM | POA: Diagnosis present

## 2023-07-28 DIAGNOSIS — K5730 Diverticulosis of large intestine without perforation or abscess without bleeding: Secondary | ICD-10-CM | POA: Diagnosis not present

## 2024-07-11 ENCOUNTER — Ambulatory Visit: Admission: EM | Admit: 2024-07-11 | Discharge: 2024-07-11 | Disposition: A | Discharge status: Home / Self Care

## 2024-07-11 DIAGNOSIS — H6992 Unspecified Eustachian tube disorder, left ear: Secondary | ICD-10-CM | POA: Diagnosis not present

## 2024-07-11 MED ORDER — FLUTICASONE PROPIONATE 50 MCG/ACT NA SUSP: 2.0000 | Freq: Every day | NASAL | 0 refills | Status: AC

## 2024-07-11 MED ORDER — CETIRIZINE-PSEUDOEPHEDRINE ER 5-120 MG PO TB12: 1.0000 | ORAL_TABLET | Freq: Every day | ORAL | 0 refills | Status: AC

## 2024-07-11 NOTE — ED Provider Notes (Signed)
 [facility]    CSN: [id number] Arrival date & time: 07/11/24  9161      History   Chief Complaint Chief Complaint  Patient presents with   Otalgia    HPI Brian Waters is a 63 y.o. male.   Discussed the use of AI scribe software for clinical note transcription with the patient, who gave verbal consent to proceed.   The patient presents with a complaint of left ear fullness that has persisted for a few weeks. The patient reports experiencing reduced hearing in the affected ear as well as tinnitus, but denies any dizziness or headache. They mention having some nasal congestion as well. No runny nose, post-nasal drainage, sore throat, sneezing or fevers. No pain or drainage. Right ear is unaffected.  The patient wears hearing aids and had a recent visit to a walk-in clinic a few weeks ago for ear-related issues. At that time, they had ear pain, which was addressed by cleaning out ear wax and prescribing Cortisporin drops. While the pain subsided following this treatment, the sensation of fullness in the ear has continued. He also mentions having had recent dental problems, which he prioritized addressing before seeking care for the ear issue.   The following portions of the patient's history were reviewed and updated as appropriate: allergies, current medications, past family history, past medical history, past social history, past surgical history, and problem list.    Past Medical History:  Diagnosis Date   Coronary artery disease    Hyperlipidemia    Kidney stone    Seasonal allergies     Patient Active Problem List   Diagnosis Date Noted   Elevated blood pressure reading 12/15/2022   Bilateral carotid artery stenosis 01/12/2022   Atherosclerosis of abdominal aorta (HCC) 11/24/2021   Benign essential HTN 07/24/2019   Combined fat and carbohydrate induced hyperlipemia 02/18/2016   Arteriosclerosis of coronary artery 02/18/2016   NSTEMI (non-ST elevated  myocardial infarction) (HCC) 02/02/2016   Chest pain 01/31/2016   Primary osteoarthritis, unspecified shoulder 10/23/2015   Allergic rhinitis 07/24/2014    Past Surgical History:  Procedure Laterality Date   CARDIAC CATHETERIZATION Bilateral 02/02/2016   Procedure: Left Heart Cath and Coronary Angiography;  Surgeon: Vinie DELENA Jude, MD;  Location: [facility];  Service: Cardiovascular;  Laterality: Bilateral;   CARDIAC CATHETERIZATION N/A 02/02/2016   Procedure: Coronary Stent Intervention;  Surgeon: Cara JONETTA Lovelace, MD;  Location: [facility];  Service: Cardiovascular;  Laterality: N/A;   CORONARY ANGIOPLASTY WITH STENT PLACEMENT     CYST EXCISION     HERNIA REPAIR     SHOULDER ARTHROSCOPY         Home Medications    Prior to Admission medications   Medication Sig Start Date End Date Taking? Authorizing Provider  amLODipine (NORVASC) 2.5 MG tablet Take 2.5 mg by mouth daily. 12/24/22  Yes [provider]  cetirizine -pseudoephedrine  (ZYRTEC -D) 5-120 MG tablet Take 1 tablet by mouth daily with breakfast for 10 days. 07/11/24 07/21/24 Yes Iola Lukes, FNP  fluticasone  (FLONASE ) 50 MCG/ACT nasal spray Place 2 sprays into both nostrils daily. Shake well before use. Gently blow nose before spraying. Do not blow nose immediately after use. You should not taste the medication or feel it going down your throat; if you do, adjust your technique. 07/11/24  Yes Iola Lukes, FNP  pravastatin (PRAVACHOL) 40 MG tablet Take 40 mg by mouth nightly. 08/15/23  Yes [provider]  aspirin  EC 81 MG tablet  Take 1 tablet (81 mg total) by mouth daily. 02/03/16   Sainani, Vivek J, MD  atorvastatin  (LIPITOR) 40 MG tablet Take 1 tablet (40 mg total) by mouth daily at 6 PM. 02/03/16   Sainani, Vivek J, MD  EPINEPHrine 0.3 mg/0.3 mL IJ SOAJ injection Inject into the muscle as directed. 06/27/20   [provider]  metaxalone  (SKELAXIN ) 800 MG tablet Take 1 tablet  (800 mg total) by mouth 3 (three) times daily. 12/11/20   Harris, Michael D, PA-C  montelukast (SINGULAIR) 10 MG tablet Take by mouth. 05/19/20   [provider]  Multiple Vitamin (MULTIVITAMIN) tablet Take 1 tablet by mouth daily.    [provider]  naproxen  (NAPROSYN ) 500 MG tablet Take 1 tablet (500 mg total) by mouth 2 (two) times daily. 12/11/20   Harris, Michael D, PA-C  predniSONE  (DELTASONE ) 50 MG tablet Take 1 tablet (50 mg total) by mouth daily. 02/23/22   Jason Leita Blush, MD  valsartan (DIOVAN) 40 MG tablet Take 40 mg by mouth daily. 07/26/20   [provider]    Family History Family History  Problem Relation Age of Onset   CAD Father        CABG x 3   Prostate cancer Father    Bladder Cancer Father    Hyperlipidemia Father    Other Mother        unknow medical history    Social History Social History   Tobacco Use   Smoking status: Never   Smokeless tobacco: Never  Vaping Use   Vaping status: Never Used  Substance Use Topics   Alcohol use: Yes    Alcohol/week: 7.0 standard drinks of alcohol    Types: 7 Glasses of wine per week    Comment: social   Drug use: Never     Allergies   Morphine  and codeine, Amoxicillin, Ibuprofen, Codeine, and Penicillins   Review of Systems Review of Systems  Constitutional:  Negative for fever.  HENT:  Positive for congestion, hearing loss and tinnitus. Negative for ear discharge, ear pain, postnasal drip, rhinorrhea, sneezing and sore throat.   Neurological:  Negative for dizziness and headaches.  All other systems reviewed and are negative.    Physical Exam Triage Vital Signs ED Triage Vitals  Encounter Vitals Group     BP 07/11/24 0856 127/77     Girls Systolic BP Percentile --      Girls Diastolic BP Percentile --      Boys Systolic BP Percentile --      Boys Diastolic BP Percentile --      Pulse Rate 07/11/24 0856 66     Resp 07/11/24 0856 16     Temp 07/11/24 0856 98.6 F (37 C)      Temp Source 07/11/24 0856 Oral     SpO2 07/11/24 0856 96 %     Weight 07/11/24 0854 194 lb (88 kg)     Height 07/11/24 0854 5' 9 (1.753 m)     Head Circumference --      Peak Flow --      Pain Score 07/11/24 0859 0     Pain Loc --      Pain Education --      Exclude from Growth Chart --    No data found.  Updated Vital Signs BP 127/77 (BP Location: Right Arm)   Pulse 66   Temp 98.6 F (37 C) (Oral)   Resp 16   Ht 5' 9 (1.753 m)  Wt 194 lb (88 kg)   SpO2 96%   BMI 28.65 kg/m   Visual Acuity Right Eye Distance:   Left Eye Distance:   Bilateral Distance:    Right Eye Near:   Left Eye Near:    Bilateral Near:     Physical Exam Vitals reviewed.  Constitutional:      General: He is awake. He is not in acute distress.    Appearance: Normal appearance. He is well-developed. He is not ill-appearing, toxic-appearing or diaphoretic.  HENT:     Head: Normocephalic.     Right Ear: Hearing, tympanic membrane, ear canal and external ear normal. No drainage, swelling or tenderness. No middle ear effusion. There is no impacted cerumen. Tympanic membrane is not erythematous.     Left Ear: Hearing, tympanic membrane, ear canal and external ear normal. No drainage, swelling or tenderness.  No middle ear effusion. There is no impacted cerumen. Tympanic membrane is not erythematous.     Ears:     Comments: Hearing grossly intact bilaterally    Nose: Nose normal.     Mouth/Throat:     Mouth: Mucous membranes are moist.  Eyes:     General: Vision grossly intact.     Conjunctiva/sclera: Conjunctivae normal.  Cardiovascular:     Rate and Rhythm: Normal rate and regular rhythm.     Heart sounds: Normal heart sounds.  Pulmonary:     Effort: Pulmonary effort is normal.     Breath sounds: Normal breath sounds and air entry.  Musculoskeletal:        General: Normal range of motion.     Cervical back: Full passive range of motion without pain, normal range of motion and neck supple.   Skin:    General: Skin is warm and dry.  Neurological:     General: No focal deficit present.     Mental Status: He is alert and oriented to person, place, and time.  Psychiatric:        Speech: Speech normal.        Behavior: Behavior is cooperative.      UC Treatments / Results  Labs (all labs ordered are listed, but only abnormal results are displayed) Labs Reviewed - No data to display  EKG   Radiology No results found.  Procedures Procedures (including critical care time)  Medications Ordered in UC Medications - No data to display  Initial Impression / Assessment and Plan / UC Course  I have reviewed the triage vital signs and the nursing notes.  Pertinent labs & imaging results that were available during my care of the patient were reviewed by me and considered in my medical decision making (see chart for details).     The patient presents with a complaint of ear fullness. Physical exam revealed no evidence of infection, effusion, or tympanic membrane rupture. Given the absence of abnormal findings and symptom description, the presentation is most consistent with eustachian tube dysfunction. Supportive management was recommended, including use combination antihistamine and decongestant pill (Zyrtec -D) daily, Flonase  nasal spray and strategies such as swallowing, yawning, or chewing gum to help equalize ear pressure. The patient was advised to monitor for worsening or persistent symptoms and to follow up with their primary care provider or an ENT specialist if symptoms do not improve. Immediate emergency evaluation was advised for severe headache, new hearing loss, dizziness, drainage from the ear, or swelling/redness behind the ear.  Today's evaluation has revealed no signs of a dangerous process. Discussed diagnosis  with patient and/or guardian. Patient and/or guardian aware of their diagnosis, possible red flag symptoms to watch out for and need for close follow up.  Patient and/or guardian understands verbal and written discharge instructions. Patient and/or guardian comfortable with plan and disposition.  Patient and/or guardian has a clear mental status at this time, good insight into illness (after discussion and teaching) and has clear judgment to make decisions regarding their care  Documentation was completed with the aid of voice recognition software. Transcription may contain typographical errors. Final Clinical Impressions(s) / UC Diagnoses   Final diagnoses:  Dysfunction of left eustachian tube     Discharge Instructions      You were seen today for ear discomfort. Your exam does not show a ruptured eardrum or signs of an ear infection. Your symptoms are most consistent with eustachian tube dysfunction, which occurs when the tube that helps regulate pressure in the ear becomes blocked or does not open properly. Take medications as prescribed. Avoid getting water in the ear. To help manage symptoms at home, try using warm compresses over the ear, chewing gum, swallowing frequently, or yawning to help open the eustachian tube. Avoid swimming, forceful nose blowing, or inserting objects into the ear.  If your symptoms do not improve after completing the prescribed treatment, you should follow up with your primary care provider or an ear, nose, and throat (ENT) specialist for further evaluation. Seek emergency care right away if you develop a severe headache, stiff neck, difficulty swallowing, redness or swelling behind the ear, drainage of fluid or blood from the ear, new hearing loss, or severe dizziness.    ED Prescriptions     Medication Sig Dispense Auth. Provider   cetirizine -pseudoephedrine  (ZYRTEC -D) 5-120 MG tablet Take 1 tablet by mouth daily with breakfast for 10 days. 10 tablet Iola Lukes, FNP   fluticasone  (FLONASE ) 50 MCG/ACT nasal spray Place 2 sprays into both nostrils daily. Shake well before use. Gently blow nose before  spraying. Do not blow nose immediately after use. You should not taste the medication or feel it going down your throat; if you do, adjust your technique. 16 g Iola Lukes, FNP      PDMP not reviewed this encounter.   Iola Lukes, [place] 07/11/24 1006

## 2024-07-11 NOTE — Discharge Instructions (Addendum)
 You were seen today for ear discomfort. Your exam does not show a ruptured eardrum or signs of an ear infection. Your symptoms are most consistent with eustachian tube dysfunction, which occurs when the tube that helps regulate pressure in the ear becomes blocked or does not open properly. Take medications as prescribed. Avoid getting water in the ear. To help manage symptoms at home, try using warm compresses over the ear, chewing gum, swallowing frequently, or yawning to help open the eustachian tube. Avoid swimming, forceful nose blowing, or inserting objects into the ear.  If your symptoms do not improve after completing the prescribed treatment, you should follow up with your primary care provider or an ear, nose, and throat (ENT) specialist for further evaluation. Seek emergency care right away if you develop a severe headache, stiff neck, difficulty swallowing, redness or swelling behind the ear, drainage of fluid or blood from the ear, new hearing loss, or severe dizziness.

## 2024-07-11 NOTE — ED Triage Notes (Signed)
 Pt c/o L ear pain & fullness x2 wks. Was seen on 8/9 for the same issue. Given cortisporin for 7 days w/o relief.

## 2024-08-12 ENCOUNTER — Ambulatory Visit: Admission: EM | Admit: 2024-08-12 | Discharge: 2024-08-12 | Disposition: A | Discharge status: Home / Self Care

## 2024-08-12 DIAGNOSIS — H938X2 Other specified disorders of left ear: Secondary | ICD-10-CM | POA: Diagnosis not present

## 2024-08-12 NOTE — ED Provider Notes (Signed)
 [facility]    CSN: [id number] Arrival date & time: 08/12/24  1359      History   Chief Complaint Chief Complaint  Patient presents with   Ear Fullness    HPI Brian Waters is a 63 y.o. male presenting for 2 to 3 months of left ear stuffiness.  He has been seen by ENT multiple times for this, as well as urgent care, and has a follow-up with ENT scheduled for 08/15/2024 (3 days from now).  He describes left ear fullness, and mildly decreased hearing.  Denies dizziness, headache.  Denies nasal congestion, sinus pressure.  Denies recent URI.  Denies URI prior to onset of symptoms.  Precipitating factor may have been cerumen impaction, which was addressed at another urgent care, and he was prescribed with Cortisporin drops after.  He also mentions that he recently had a filling redone, but this does not resolve the ear issue.  He is taking Flonase  daily, allergy shots for 4 years.  He was taking Singulair, stopped this 12/2023 due to lack of perceived benefit, then restarted it 3 weeks ago.  Wife notes some anxiety surrounding the current condition, and he is feeling hyperaware of his body    10/8 ENT   HPI  Past Medical History:  Diagnosis Date   Coronary artery disease    Hyperlipidemia    Kidney stone    Seasonal allergies     Patient Active Problem List   Diagnosis Date Noted   Elevated blood pressure reading 12/15/2022   Bilateral carotid artery stenosis 01/12/2022   Atherosclerosis of abdominal aorta 11/24/2021   Benign essential HTN 07/24/2019   Combined fat and carbohydrate induced hyperlipemia 02/18/2016   Arteriosclerosis of coronary artery 02/18/2016   NSTEMI (non-ST elevated myocardial infarction) (HCC) 02/02/2016   Chest pain 01/31/2016   Primary osteoarthritis, unspecified shoulder 10/23/2015   Allergic rhinitis 07/24/2014    Past Surgical History:  Procedure Laterality Date   CARDIAC CATHETERIZATION Bilateral 02/02/2016   Procedure: Left Heart  Cath and Coronary Angiography;  Surgeon: Vinie DELENA Jude, MD;  Location: [facility];  Service: Cardiovascular;  Laterality: Bilateral;   CARDIAC CATHETERIZATION N/A 02/02/2016   Procedure: Coronary Stent Intervention;  Surgeon: Cara JONETTA Lovelace, MD;  Location: [facility];  Service: Cardiovascular;  Laterality: N/A;   CORONARY ANGIOPLASTY WITH STENT PLACEMENT     CYST EXCISION     HERNIA REPAIR     SHOULDER ARTHROSCOPY         Home Medications    Prior to Admission medications   Medication Sig Start Date End Date Taking? Authorizing Provider  amLODipine (NORVASC) 2.5 MG tablet Take 2.5 mg by mouth daily. 12/24/22  Yes [provider]  aspirin  EC 81 MG tablet Take 1 tablet (81 mg total) by mouth daily. 02/03/16  Yes Sainani, Vivek J, MD  fluticasone  (FLONASE ) 50 MCG/ACT nasal spray Place 2 sprays into both nostrils daily. Shake well before use. Gently blow nose before spraying. Do not blow nose immediately after use. You should not taste the medication or feel it going down your throat; if you do, adjust your technique. 07/11/24  Yes Iola Lukes, FNP  Multiple Vitamin (MULTIVITAMIN) tablet Take 1 tablet by mouth daily.   Yes [provider]  pravastatin (PRAVACHOL) 40 MG tablet Take 40 mg by mouth nightly. 08/15/23  Yes [provider]  valsartan (DIOVAN) 40 MG tablet Take 40 mg by mouth daily. 07/26/20  Yes [provider]  atorvastatin  (LIPITOR) 40 MG tablet Take 1 tablet (40 mg total) by mouth daily at 6 PM. 02/03/16   Sainani, Vivek J, MD  EPINEPHrine 0.3 mg/0.3 mL IJ SOAJ injection Inject into the muscle as directed. 06/27/20   [provider]  metaxalone  (SKELAXIN ) 800 MG tablet Take 1 tablet (800 mg total) by mouth 3 (three) times daily. 12/11/20   Harris, Michael D, PA-C  montelukast (SINGULAIR) 10 MG tablet Take by mouth. 05/19/20   [provider]  naproxen  (NAPROSYN ) 500 MG tablet Take 1 tablet (500 mg total) by  mouth 2 (two) times daily. 12/11/20   Harris, Michael D, PA-C  predniSONE  (DELTASONE ) 50 MG tablet Take 1 tablet (50 mg total) by mouth daily. 02/23/22   Jason Leita Blush, MD    Family History Family History  Problem Relation Age of Onset   CAD Father        CABG x 3   Prostate cancer Father    Bladder Cancer Father    Hyperlipidemia Father    Other Mother        unknow medical history    Social History Social History   Tobacco Use   Smoking status: Never   Smokeless tobacco: Never  Vaping Use   Vaping status: Never Used  Substance Use Topics   Alcohol use: Yes    Alcohol/week: 7.0 standard drinks of alcohol    Types: 7 Glasses of wine per week    Comment: social   Drug use: Never     Allergies   Morphine  and codeine, Amoxicillin, Ibuprofen, Codeine, and Penicillins   Review of Systems Review of Systems  Constitutional:  Negative for appetite change, chills and fever.  HENT:  Negative for congestion, ear pain, rhinorrhea, sinus pressure, sinus pain and sore throat.        Ear fullness  Eyes:  Negative for redness and visual disturbance.  Respiratory:  Negative for cough, chest tightness, shortness of breath and wheezing.   Cardiovascular:  Negative for chest pain and palpitations.  Gastrointestinal:  Negative for abdominal pain, constipation, diarrhea, nausea and vomiting.  Genitourinary:  Negative for dysuria, frequency and urgency.  Musculoskeletal:  Negative for myalgias.  Neurological:  Negative for dizziness, weakness and headaches.  Psychiatric/Behavioral:  Negative for confusion.   All other systems reviewed and are negative.    Physical Exam Triage Vital Signs ED Triage Vitals  Encounter Vitals Group     BP 08/12/24 1427 138/80     Girls Systolic BP Percentile --      Girls Diastolic BP Percentile --      Boys Systolic BP Percentile --      Boys Diastolic BP Percentile --      Pulse Rate 08/12/24 1427 73     Resp 08/12/24 1427 18     Temp  08/12/24 1427 98.3 F (36.8 C)     Temp Source 08/12/24 1427 Temporal     SpO2 08/12/24 1427 98 %     Weight 08/12/24 1426 189 lb (85.7 kg)     Height --      Head Circumference --      Peak Flow --      Pain Score 08/12/24 1426 0     Pain Loc --      Pain Education --      Exclude from Growth Chart --    No data found.  Updated Vital Signs BP 138/80 (BP Location: Left Arm)   Pulse 73   Temp  98.3 F (36.8 C) (Temporal)   Resp 18   Wt 189 lb (85.7 kg)   SpO2 98%   BMI 27.91 kg/m   Visual Acuity Right Eye Distance:   Left Eye Distance:   Bilateral Distance:    Right Eye Near:   Left Eye Near:    Bilateral Near:     Physical Exam Vitals reviewed.  Constitutional:      Appearance: Normal appearance. He is not ill-appearing.  HENT:     Head: Normocephalic and atraumatic.     Right Ear: Hearing, tympanic membrane, ear canal and external ear normal. No swelling or tenderness. No middle ear effusion. There is no impacted cerumen. No mastoid tenderness. Tympanic membrane is not injected, scarred, perforated, erythematous, retracted or bulging.     Left Ear: Hearing, tympanic membrane, ear canal and external ear normal. No swelling or tenderness.  No middle ear effusion. There is no impacted cerumen. No mastoid tenderness. Tympanic membrane is not injected, scarred, perforated, erythematous, retracted or bulging.     Mouth/Throat:     Pharynx: Oropharynx is clear. No oropharyngeal exudate or posterior oropharyngeal erythema.  Cardiovascular:     Rate and Rhythm: Normal rate and regular rhythm.     Heart sounds: Normal heart sounds.  Pulmonary:     Effort: Pulmonary effort is normal.     Breath sounds: Normal breath sounds.  Lymphadenopathy:     Cervical: No cervical adenopathy.  Neurological:     General: No focal deficit present.     Mental Status: He is alert and oriented to person, place, and time.  Psychiatric:        Mood and Affect: Mood normal.        Behavior:  Behavior normal.        Thought Content: Thought content normal.        Judgment: Judgment normal.      UC Treatments / Results  Labs (all labs ordered are listed, but only abnormal results are displayed) Labs Reviewed - No data to display  EKG   Radiology No results found.  Procedures Procedures (including critical care time)  Medications Ordered in UC Medications - No data to display  Initial Impression / Assessment and Plan / UC Course  I have reviewed the triage vital signs and the nursing notes.  Pertinent labs & imaging results that were available during my care of the patient were reviewed by me and considered in my medical decision making (see chart for details).     Patient is a 63 year old male presenting with persistent sensation of fullness in the left ear.  Precipitating factor may have been cerumen impaction, which was resolved months ago at another urgent care.  He has established with an ear nose and throat doctor, and has completed 2 courses of prednisone  taper, he has 1 day left of the current taper.  He is taking Flonase , Singulair, allergy shots.  He denies recent URI, and today is afebrile and nontachycardic, without symptoms concerning for a viral illness.  His next follow-up with ENT is 08/15/2024.  Exam is reassuring.  He does not have acute otitis media, otitis externa, sinusitis, or other.  He is not acutely ill, and we did not check testing for COVID or flu.  We discussed that it would be appropriate for him to follow-up with Ear nose and throat for his ear condition, and primary care for health anxiety.  Previsit time: 4 minutes Time in room: 12 minutes Time after visit: 6  minutes  Final Clinical Impressions(s) / UC Diagnoses   Final diagnoses:  Ear fullness, left   Discharge Instructions   None    ED Prescriptions   None    PDMP not reviewed this encounter.   Arlyss Leita BRAVO, PA-C 08/12/24 [phone]

## 2024-08-12 NOTE — ED Triage Notes (Signed)
 Patient states that he's been having issues with his left ear for a few months. Left ear is stuffy with stuffiness. Patient has been to the ENT and hasn't found anything. Patient sates that theres fluid behind his ear drum. Patient states that he's been on multiple rounds of prednisone . Patient states that the left side of his head feels like its wrapped in cotton. Patient states that he's not having any other sx. Patient states that he's having hearing loss. Patient states that it feels like something is below the ear.

## 2024-09-11 ENCOUNTER — Other Ambulatory Visit: Payer: Self-pay | Admitting: Otolaryngology

## 2024-09-12 ENCOUNTER — Other Ambulatory Visit: Payer: Self-pay

## 2024-09-12 MED ORDER — CIPROFLOXACIN-DEXAMETHASONE 0.3-0.1 % OT SUSP
3.0000 [drp] | Freq: Three times a day (TID) | OTIC | 0 refills | Status: DC
  Filled 2024-09-12: qty 7.5, 5d supply, fill #0

## 2024-09-18 ENCOUNTER — Encounter: Payer: Self-pay | Admitting: Otolaryngology

## 2024-09-19 ENCOUNTER — Encounter: Payer: Self-pay | Admitting: Otolaryngology

## 2024-09-19 NOTE — Anesthesia Preprocedure Evaluation (Signed)
 Anesthesia Evaluation    Airway        Dental   Pulmonary           Cardiovascular      Neuro/Psych    GI/Hepatic   Endo/Other    Renal/GU      Musculoskeletal   Abdominal   Peds  Hematology   Anesthesia Other Findings   Reproductive/Obstetrics                              Anesthesia Physical Anesthesia Plan Anesthesia Quick Evaluation

## 2024-09-20 ENCOUNTER — Ambulatory Visit: Admission: RE | Admit: 2024-09-20 | Source: Home / Self Care | Admitting: Otolaryngology

## 2024-09-20 ENCOUNTER — Encounter: Payer: Self-pay | Admitting: Anesthesiology

## 2024-09-20 ENCOUNTER — Encounter: Admission: RE | Payer: Self-pay | Source: Home / Self Care

## 2024-09-20 HISTORY — DX: Atherosclerosis of aorta: I70.0

## 2024-09-20 HISTORY — DX: Non-ST elevation (NSTEMI) myocardial infarction: I21.4

## 2024-09-20 HISTORY — DX: Angina pectoris, unspecified: I20.9

## 2024-09-20 HISTORY — DX: Personal history of urinary calculi: Z87.442

## 2024-09-20 SURGERY — MYRINGOTOMY WITH TUBE PLACEMENT
Anesthesia: General | Laterality: Left

## 2024-09-23 ENCOUNTER — Ambulatory Visit
Admission: EM | Admit: 2024-09-23 | Discharge: 2024-09-23 | Disposition: A | Discharge status: Home / Self Care | Attending: Emergency Medicine | Admitting: Emergency Medicine

## 2024-09-23 ENCOUNTER — Encounter: Payer: Self-pay | Admitting: Emergency Medicine

## 2024-09-23 DIAGNOSIS — T63461A Toxic effect of venom of wasps, accidental (unintentional), initial encounter: Secondary | ICD-10-CM

## 2024-09-23 DIAGNOSIS — L03011 Cellulitis of right finger: Secondary | ICD-10-CM

## 2024-09-23 MED ORDER — MUPIROCIN 2 % EX OINT: 1.0000 | TOPICAL_OINTMENT | Freq: Two times a day (BID) | CUTANEOUS | 0 refills | Status: AC

## 2024-09-23 MED ORDER — CLINDAMYCIN HCL 150 MG PO CAPS: 450.0000 mg | ORAL_CAPSULE | Freq: Three times a day (TID) | ORAL | 0 refills | Status: AC

## 2024-09-23 NOTE — ED Provider Notes (Signed)
 [facility]    CSN: [id number] Arrival date & time: 09/23/24  1323      History   Chief Complaint Chief Complaint  Patient presents with   Insect Bite    HPI KAYAN BLISSETT is a 63 y.o. male.   63 year old male pt, Deshan Hemmelgarn, presents to urgent care for evaluation of right thumb wound.  Patient states he was stung by yellowjacket yesterday and noticed swelling this morning, now has discolored area with oozing where thesting was on right hand. Pt aplied ice,may have scratched area, has not elevated right extremity.  The history is provided by the patient. No language interpreter was used.    Past Medical History:  Diagnosis Date   Anginal pain    Atherosclerosis of abdominal aorta    Coronary artery disease    has stent   History of kidney stones    Hyperlipidemia    NSTEMI (non-ST elevated myocardial infarction) (HCC)    Seasonal allergies     Patient Active Problem List   Diagnosis Date Noted   Wasp sting 09/23/2024   Cellulitis of finger of right hand 09/23/2024   Elevated blood pressure reading 12/15/2022   Bilateral carotid artery stenosis 01/12/2022   Atherosclerosis of abdominal aorta 11/24/2021   Benign essential HTN 07/24/2019   Combined fat and carbohydrate induced hyperlipemia 02/18/2016   Arteriosclerosis of coronary artery 02/18/2016   NSTEMI (non-ST elevated myocardial infarction) (HCC) 02/02/2016   Chest pain 01/31/2016   Primary osteoarthritis, unspecified shoulder 10/23/2015   Allergic rhinitis 07/24/2014    Past Surgical History:  Procedure Laterality Date   CARDIAC CATHETERIZATION Bilateral 02/02/2016   Procedure: Left Heart Cath and Coronary Angiography;  Surgeon: Vinie DELENA Jude, MD;  Location: [facility];  Service: Cardiovascular;  Laterality: Bilateral;   CARDIAC CATHETERIZATION N/A 02/02/2016   Procedure: Coronary Stent Intervention;  Surgeon: Cara JONETTA Lovelace, MD;  Location: [facility];  Service:  Cardiovascular;  Laterality: N/A;   CORONARY ANGIOPLASTY WITH STENT PLACEMENT     CYST EXCISION     HERNIA REPAIR     SHOULDER ARTHROSCOPY         Home Medications    Prior to Admission medications   Medication Sig Start Date End Date Taking? Authorizing Provider  aspirin  EC 81 MG tablet Take 1 tablet (81 mg total) by mouth daily. 02/03/16  Yes Sainani, Vivek J, MD  clindamycin (CLEOCIN) 150 MG capsule Take 3 capsules (450 mg total) by mouth 3 (three) times daily for 5 days. 09/23/24 09/28/24 Yes Brenee Gajda, NP  fluticasone  (FLONASE ) 50 MCG/ACT nasal spray Place 2 sprays into both nostrils daily. Shake well before use. Gently blow nose before spraying. Do not blow nose immediately after use. You should not taste the medication or feel it going down your throat; if you do, adjust your technique. 07/11/24  Yes Iola Lukes, FNP  Multiple Vitamin (MULTIVITAMIN) tablet Take 1 tablet by mouth daily.   Yes [provider]  mupirocin ointment (BACTROBAN) 2 % Apply 1 Application topically 2 (two) times daily for 7 days. Right thumb 09/23/24 09/30/24 Yes Cam Harnden, Rilla, NP  pravastatin (PRAVACHOL) 40 MG tablet Take 40 mg by mouth nightly. 08/15/23  Yes [provider]  valsartan (DIOVAN) 40 MG tablet Take 40 mg by mouth daily. 07/26/20  Yes [provider]  COENZYME Q10-VITAMIN E PO Take 1 tablet by mouth daily.    [provider]  EPINEPHrine 0.3 mg/0.3 mL IJ SOAJ  injection Inject into the muscle as directed. 06/27/20   [provider]  loratadine  (CLARITIN ) 10 MG tablet Take 10 mg by mouth daily as needed for allergies.    [provider]  naproxen  (NAPROSYN ) 500 MG tablet Take 1 tablet (500 mg total) by mouth 2 (two) times daily. Patient taking differently: Take 500 mg by mouth as needed. 12/11/20   Arloa Ozell BIRCH, PA-C    Family History Family History  Problem Relation Age of Onset   CAD Father        CABG x 3   Prostate  cancer Father    Bladder Cancer Father    Hyperlipidemia Father    Other Mother        unknow medical history    Social History Social History   Tobacco Use   Smoking status: Never   Smokeless tobacco: Never  Vaping Use   Vaping status: Never Used  Substance Use Topics   Alcohol use: Not Currently    Alcohol/week: 1.0 standard drink of alcohol    Types: 1 Cans of beer per week    Comment: social and sometimes weekly   Drug use: Never     Allergies   Morphine  and codeine, Amoxicillin, Ibuprofen, Codeine, and Penicillins   Review of Systems Review of Systems  Constitutional:  Negative for fever.  Skin:  Positive for color change and wound.  All other systems reviewed and are negative.    Physical Exam Triage Vital Signs ED Triage Vitals  Encounter Vitals Group     BP 09/23/24 1343 (!) 143/93     Girls Systolic BP Percentile --      Girls Diastolic BP Percentile --      Boys Systolic BP Percentile --      Boys Diastolic BP Percentile --      Pulse Rate 09/23/24 1343 86     Resp 09/23/24 1343 16     Temp 09/23/24 1343 98.5 F (36.9 C)     Temp Source 09/23/24 1343 Oral     SpO2 09/23/24 1343 95 %     Weight 09/23/24 1341 194 lb 0.1 oz (88 kg)     Height 09/23/24 1341 5' 9 (1.753 m)     Head Circumference --      Peak Flow --      Pain Score 09/23/24 1341 5     Pain Loc --      Pain Education --      Exclude from Growth Chart --    No data found.  Updated Vital Signs BP (!) 143/93 (BP Location: Right Arm)   Pulse 86   Temp 98.5 F (36.9 C) (Oral)   Resp 16   Ht 5' 9 (1.753 m)   Wt 194 lb 0.1 oz (88 kg)   SpO2 95%   BMI 28.65 kg/m   Visual Acuity Right Eye Distance:   Left Eye Distance:   Bilateral Distance:    Right Eye Near:   Left Eye Near:    Bilateral Near:     Physical Exam Vitals and nursing note reviewed.  Cardiovascular:     Rate and Rhythm: Normal rate.     Pulses:          Radial pulses are 2+ on the right side.  Skin:     General: Skin is warm.     Capillary Refill: Capillary refill takes less than 2 seconds.     Findings: Ecchymosis, signs of injury and wound present.  Comments: See photo  Neurological:     General: No focal deficit present.     Mental Status: He is alert and oriented to person, place, and time.     GCS: GCS eye subscore is 4. GCS verbal subscore is 5. GCS motor subscore is 6.     Cranial Nerves: No cranial nerve deficit.     Sensory: Sensation is intact.     Motor: Motor function is intact.  Psychiatric:        Attention and Perception: Attention normal.        Mood and Affect: Mood normal.        Speech: Speech normal.        Behavior: Behavior normal. Behavior is cooperative.      UC Treatments / Results  Labs (all labs ordered are listed, but only abnormal results are displayed) Labs Reviewed - No data to display  EKG   Radiology No results found.  Procedures Procedures (including critical care time)  Medications Ordered in UC Medications - No data to display  Initial Impression / Assessment and Plan / UC Course  I have reviewed the triage vital signs and the nursing notes.  Pertinent labs & imaging results that were available during my care of the patient were reviewed by me and considered in my medical decision making (see chart for details).  Clinical Course as of 09/23/24 1655  Sun Sep 23, 2024  1407 Wound care provided by staff, patient tolerated well. [JD]    Clinical Course User Index [JD] Deirdre Gryder, Rilla, NP  Discussed exam findings and plan of care with patient, mupirocin and clindamycin scripted d/t allergies, strict go to ER precautions given.   Patient verbalized understanding to this provider.  Ddx: Wasp sting, cellulitis, allergic reaction Final Clinical Impressions(s) / UC Diagnoses   Final diagnoses:  Wasp sting, accidental or unintentional, initial encounter  Cellulitis of finger of right hand     Discharge Instructions       Keep wound clean and covered after applying mupirocin ointment as directed, take antibiotic as directed.  Drink plenty of water.  Follow-up with PCP in 3 days for wound check.  Elevate right hand to reduce swelling, may take Benadryl  as label directed for itching.  Go to ER for new or worsening issues or concerns     ED Prescriptions     Medication Sig Dispense Auth. Provider   mupirocin ointment (BACTROBAN) 2 % Apply 1 Application topically 2 (two) times daily for 7 days. Right thumb 15 g Tasnia Spegal, NP   clindamycin (CLEOCIN) 150 MG capsule Take 3 capsules (450 mg total) by mouth 3 (three) times daily for 5 days. 45 capsule Pheonix Wisby, Rilla, NP      PDMP not reviewed this encounter.   Aminta Rilla, NP 09/23/24 1655

## 2024-09-23 NOTE — Discharge Instructions (Signed)
 Keep wound clean and covered after applying mupirocin ointment as directed, take antibiotic as directed.  Drink plenty of water.  Follow-up with PCP in 3 days for wound check.  Elevate right hand to reduce swelling, may take Benadryl  as label directed for itching.  Go to ER for new or worsening issues or concerns

## 2024-09-23 NOTE — ED Triage Notes (Signed)
 Pt states he was stung by a yellow jacket yesterday on his right thumb. He noticed the swelling this morning. Then this afternoon he noticed the purple coloring and oozing where the sting was.

## 2024-10-05 ENCOUNTER — Encounter: Payer: Self-pay | Admitting: Emergency Medicine

## 2024-10-05 ENCOUNTER — Ambulatory Visit
Admission: EM | Admit: 2024-10-05 | Discharge: 2024-10-05 | Disposition: A | Discharge status: Home / Self Care | Attending: Emergency Medicine | Admitting: Emergency Medicine

## 2024-10-05 DIAGNOSIS — L03113 Cellulitis of right upper limb: Secondary | ICD-10-CM | POA: Diagnosis not present

## 2024-10-05 MED ORDER — CLINDAMYCIN HCL 300 MG PO CAPS: 300.0000 mg | ORAL_CAPSULE | Freq: Three times a day (TID) | ORAL | 0 refills | Status: AC

## 2024-10-05 NOTE — Discharge Instructions (Addendum)
 Take clindamycin  3 times a day with food for 7 days for treatment of cellulitis to your forearm.  Continue to monitor the area of redness.  If the area becomes more red, swollen, hot to touch, red streaks start going up your arm, or fevers develop you need to either return for reevaluation or seek care in the ER.

## 2024-10-05 NOTE — ED Triage Notes (Signed)
 Pt c/o right arm redness and swelling. He was seen and treated for cellulitis on his right thumb on 11/16/ and 11/18. He finished all his abx. Unsure if he has had a fever.

## 2024-10-05 NOTE — ED Provider Notes (Signed)
 [facility]    CSN: [id number] Arrival date & time: 10/05/24  1718      History   Chief Complaint Chief Complaint  Patient presents with   Arm Pain    right    HPI Brian Waters is a 63 y.o. male.   HPI  63 year old male with past medical [place] for NSTEMI, hyperlipidemia, CAD presents for evaluation of redness and itching to the right forearm that started last night.  He was previously treated for cellulitis to the right thumb with a 5-day course of clindamycin  and he reports that he did take all his antibiotics.  He denies any fever.  He reports the symptoms he is experiencing are similar to those he experienced before developing cellulitis to his thumb.  Past Medical History:  Diagnosis Date   Anginal pain    Atherosclerosis of abdominal aorta    Coronary artery disease    has stent   History of kidney stones    Hyperlipidemia    NSTEMI (non-ST elevated myocardial infarction) (HCC)    Seasonal allergies     Patient Active Problem List   Diagnosis Date Noted   Wasp sting 09/23/2024   Cellulitis of finger of right hand 09/23/2024   Elevated blood pressure reading 12/15/2022   Bilateral carotid artery stenosis 01/12/2022   Atherosclerosis of abdominal aorta 11/24/2021   Benign essential HTN 07/24/2019   Combined fat and carbohydrate induced hyperlipemia 02/18/2016   Arteriosclerosis of coronary artery 02/18/2016   NSTEMI (non-ST elevated myocardial infarction) (HCC) 02/02/2016   Chest pain 01/31/2016   Primary osteoarthritis, unspecified shoulder 10/23/2015   Allergic rhinitis 07/24/2014    Past Surgical History:  Procedure Laterality Date   CARDIAC CATHETERIZATION Bilateral 02/02/2016   Procedure: Left Heart Cath and Coronary Angiography;  Surgeon: Vinie DELENA Jude, MD;  Location: [facility];  Service: Cardiovascular;  Laterality: Bilateral;   CARDIAC CATHETERIZATION N/A 02/02/2016   Procedure: Coronary Stent Intervention;  Surgeon:  Cara JONETTA Lovelace, MD;  Location: [facility];  Service: Cardiovascular;  Laterality: N/A;   CORONARY ANGIOPLASTY WITH STENT PLACEMENT     CYST EXCISION     HERNIA REPAIR     SHOULDER ARTHROSCOPY         Home Medications    Prior to Admission medications   Medication Sig Start Date End Date Taking? Authorizing Provider  clindamycin  (CLEOCIN ) 300 MG capsule Take 1 capsule (300 mg total) by mouth 3 (three) times daily for 7 days. 10/05/24 10/12/24 Yes Bernardino Ditch, NP  aspirin  EC 81 MG tablet Take 1 tablet (81 mg total) by mouth daily. 02/03/16   Sainani, Vivek J, MD  COENZYME Q10-VITAMIN E PO Take 1 tablet by mouth daily.    [provider]  EPINEPHrine 0.3 mg/0.3 mL IJ SOAJ injection Inject into the muscle as directed. 06/27/20   [provider]  fluticasone  (FLONASE ) 50 MCG/ACT nasal spray Place 2 sprays into both nostrils daily. Shake well before use. Gently blow nose before spraying. Do not blow nose immediately after use. You should not taste the medication or feel it going down your throat; if you do, adjust your technique. 07/11/24   Murrill, Samantha, FNP  loratadine  (CLARITIN ) 10 MG tablet Take 10 mg by mouth daily as needed for allergies.    [provider]  Multiple Vitamin (MULTIVITAMIN) tablet Take 1 tablet by mouth daily.    [provider]  pravastatin (PRAVACHOL) 40 MG tablet Take 40 mg by mouth nightly.  08/15/23   [provider]  valsartan (DIOVAN) 40 MG tablet Take 40 mg by mouth daily. 07/26/20   [provider]    Family History Family History  Problem Relation Age of Onset   CAD Father        CABG x 3   Prostate cancer Father    Bladder Cancer Father    Hyperlipidemia Father    Other Mother        unknow medical history    Social History Social History   Tobacco Use   Smoking status: Never   Smokeless tobacco: Never  Vaping Use   Vaping status: Never Used  Substance Use Topics   Alcohol use: Not  Currently    Alcohol/week: 1.0 standard drink of alcohol    Types: 1 Cans of beer per week    Comment: social and sometimes weekly   Drug use: Never     Allergies   Morphine  and codeine, Amoxicillin, Ibuprofen, Codeine, and Penicillins   Review of Systems Review of Systems  Constitutional:  Negative for fever.  Skin:  Positive for color change.     Physical Exam Triage Vital Signs ED Triage Vitals  Encounter Vitals Group     BP      Girls Systolic BP Percentile      Girls Diastolic BP Percentile      Boys Systolic BP Percentile      Boys Diastolic BP Percentile      Pulse      Resp      Temp      Temp src      SpO2      Weight      Height      Head Circumference      Peak Flow      Pain Score      Pain Loc      Pain Education      Exclude from Growth Chart    No data found.  Updated Vital Signs BP (!) 155/90 (BP Location: Left Arm)   Pulse 66   Temp 98.6 F (37 C) (Oral)   Resp 16   Ht 5' 9 (1.753 m)   Wt 194 lb 0.1 oz (88 kg)   SpO2 98%   BMI 28.65 kg/m   Visual Acuity Right Eye Distance:   Left Eye Distance:   Bilateral Distance:    Right Eye Near:   Left Eye Near:    Bilateral Near:     Physical Exam Vitals and nursing note reviewed.  Constitutional:      Appearance: Normal appearance. He is not ill-appearing.  HENT:     Head: Normocephalic and atraumatic.  Musculoskeletal:        General: Swelling present.  Skin:    Capillary Refill: Capillary refill takes less than 2 seconds.     Findings: Erythema present.  Neurological:     General: No focal deficit present.     Mental Status: He is alert and oriented to person, place, and time.      UC Treatments / Results  Labs (all labs ordered are listed, but only abnormal results are displayed) Labs Reviewed - No data to display  EKG   Radiology No results found.  Procedures Procedures (including critical care time)  Medications Ordered in UC Medications - No data to  display  Initial Impression / Assessment and Plan / UC Course  I have reviewed the triage vital signs and the nursing notes.  Pertinent  labs & imaging results that were available during my care of the patient were reviewed by me and considered in my medical decision making (see chart for details).   Patient is a pleasant, nontoxic-appearing 63 year old male presenting for evaluation of redness and itching to the right forearm.  As you can see in image above, there is erythema to the middle volar aspect of the right forearm that is warm to touch when compared to the surrounding tissue.  No induration or fluctuance.  The erythema is not blanchable.  I suspect that the patient is developing a cellulitis to the forearm.  He was previously treated with a 5-day course of clindamycin  450 mg 3 times daily.  I will discharge him home on a 7-day course of clindamycin  300 mg 3 times daily.   Final Clinical Impressions(s) / UC Diagnoses   Final diagnoses:  Cellulitis of right upper extremity     Discharge Instructions      Take clindamycin  3 times a day with food for 7 days for treatment of cellulitis to your forearm.  Continue to monitor the area of redness.  If the area becomes more red, swollen, hot to touch, red streaks start going up your arm, or fevers develop you need to either return for reevaluation or seek care in the ER.     ED Prescriptions     Medication Sig Dispense Auth. Provider   clindamycin  (CLEOCIN ) 300 MG capsule Take 1 capsule (300 mg total) by mouth 3 (three) times daily for 7 days. 21 capsule Bernardino Ditch, NP      PDMP not reviewed this encounter.   Bernardino Ditch, NP 10/05/24 [id number]

## 2024-11-30 ENCOUNTER — Other Ambulatory Visit (HOSPITAL_COMMUNITY): Payer: Self-pay
